# Patient Record
Sex: Male | Born: 1985 | Race: Black or African American | Hispanic: No | Marital: Married
Health system: Midwestern US, Community
[De-identification: ages and names within clinical notes are randomized; demographics above are authoritative.]

## PROBLEM LIST (undated history)

## (undated) DIAGNOSIS — M1990 Unspecified osteoarthritis, unspecified site: Secondary | ICD-10-CM

## (undated) DIAGNOSIS — F32A Depression, unspecified: Secondary | ICD-10-CM

## (undated) DIAGNOSIS — F1910 Other psychoactive substance abuse, uncomplicated: Secondary | ICD-10-CM

## (undated) DIAGNOSIS — F419 Anxiety disorder, unspecified: Secondary | ICD-10-CM

## (undated) DIAGNOSIS — Z9109 Other allergy status, other than to drugs and biological substances: Secondary | ICD-10-CM

## (undated) DIAGNOSIS — K219 Gastro-esophageal reflux disease without esophagitis: Secondary | ICD-10-CM

## (undated) DIAGNOSIS — M25552 Pain in left hip: Secondary | ICD-10-CM

## (undated) HISTORY — DX: Gastro-esophageal reflux disease without esophagitis: K21.9

## (undated) HISTORY — DX: Other allergy status, other than to drugs and biological substances: Z91.09

## (undated) HISTORY — DX: Other psychoactive substance abuse, uncomplicated: F19.10

## (undated) HISTORY — DX: Depression, unspecified: F32.A

## (undated) HISTORY — DX: Unspecified osteoarthritis, unspecified site: M19.90

## (undated) HISTORY — DX: Anxiety disorder, unspecified: F41.9

## (undated) HISTORY — PX: ANTERIOR CRUCIATE LIGAMENT REPAIR: SHX115

---
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
## 2010-02-08 ENCOUNTER — Ambulatory Visit (HOSPITAL_COMMUNITY): Payer: Self-pay | Admitting: Psychology

## 2010-02-15 ENCOUNTER — Ambulatory Visit (HOSPITAL_COMMUNITY): Payer: Self-pay | Admitting: Psychiatry

## 2010-02-22 ENCOUNTER — Ambulatory Visit (HOSPITAL_COMMUNITY)
Admission: RE | Admit: 2010-02-22 | Discharge: 2010-02-22 | Payer: Self-pay | Source: Home / Self Care | Attending: Psychology | Admitting: Psychology

## 2010-03-01 ENCOUNTER — Ambulatory Visit (HOSPITAL_COMMUNITY)
Admission: RE | Admit: 2010-03-01 | Discharge: 2010-03-01 | Payer: Self-pay | Source: Home / Self Care | Attending: Psychology | Admitting: Psychology

## 2010-03-08 ENCOUNTER — Ambulatory Visit (HOSPITAL_COMMUNITY)
Admission: RE | Admit: 2010-03-08 | Discharge: 2010-03-08 | Payer: Self-pay | Source: Home / Self Care | Attending: Psychology | Admitting: Psychology

## 2010-03-15 ENCOUNTER — Ambulatory Visit (HOSPITAL_COMMUNITY)
Admission: RE | Admit: 2010-03-15 | Discharge: 2010-03-15 | Payer: Self-pay | Source: Home / Self Care | Attending: Psychology | Admitting: Psychology

## 2010-03-16 ENCOUNTER — Ambulatory Visit (HOSPITAL_COMMUNITY): Admit: 2010-03-16 | Payer: Self-pay | Admitting: Physician Assistant

## 2010-09-23 ENCOUNTER — Encounter (HOSPITAL_COMMUNITY): Payer: 59 | Admitting: Psychology

## 2010-09-23 DIAGNOSIS — F4322 Adjustment disorder with anxiety: Secondary | ICD-10-CM

## 2010-10-14 ENCOUNTER — Encounter (HOSPITAL_COMMUNITY): Payer: 59 | Admitting: Psychology

## 2010-10-14 DIAGNOSIS — F329 Major depressive disorder, single episode, unspecified: Secondary | ICD-10-CM

## 2010-11-11 ENCOUNTER — Encounter (INDEPENDENT_AMBULATORY_CARE_PROVIDER_SITE_OTHER): Payer: 59 | Admitting: Psychology

## 2010-11-11 DIAGNOSIS — F4320 Adjustment disorder, unspecified: Secondary | ICD-10-CM

## 2010-12-09 ENCOUNTER — Encounter (HOSPITAL_COMMUNITY): Payer: 59 | Admitting: Psychology

## 2011-09-06 ENCOUNTER — Telehealth: Payer: Self-pay | Admitting: Pulmonary Disease

## 2011-09-06 NOTE — Telephone Encounter (Signed)
Per SN---yes we can schedule the next aval for the pt to have a cpx.  thanks

## 2011-09-06 NOTE — Telephone Encounter (Signed)
Called, spoke with pt who states he is in search for a PCP.  His father is a pt of SN's - Bless Bels[facility].  His uncle, Shadd Dunstan, is a pt of SN's as well.  He would like to know if Dr. Kriste Basque would accept him as a patient.  He has not been seen by a PCP recently.  Dr. Kriste Basque, pls advise.  Thank you.

## 2011-09-06 NOTE — Telephone Encounter (Signed)
I spoke with pt and is scheduled to come in 10/17/11 at 11:00 for CPX. Nothing further was needed

## 2011-10-17 ENCOUNTER — Encounter: Payer: 59 | Admitting: Pulmonary Disease

## 2012-01-05 ENCOUNTER — Encounter: Payer: Self-pay | Admitting: *Deleted

## 2012-01-08 ENCOUNTER — Encounter: Payer: Self-pay | Admitting: Pulmonary Disease

## 2012-01-08 ENCOUNTER — Ambulatory Visit (INDEPENDENT_AMBULATORY_CARE_PROVIDER_SITE_OTHER): Payer: BC Managed Care – PPO | Admitting: Pulmonary Disease

## 2012-01-08 ENCOUNTER — Ambulatory Visit (INDEPENDENT_AMBULATORY_CARE_PROVIDER_SITE_OTHER)
Admission: RE | Admit: 2012-01-08 | Discharge: 2012-01-08 | Disposition: A | Discharge status: Home / Self Care | Payer: BC Managed Care – PPO | Source: Ambulatory Visit | Attending: Pulmonary Disease | Admitting: Pulmonary Disease

## 2012-01-08 VITALS — BP 126/82 | HR 51 | Temp 97.8°F | Ht 72.0 in | Wt 260.0 lb

## 2012-01-08 DIAGNOSIS — J351 Hypertrophy of tonsils: Secondary | ICD-10-CM | POA: Insufficient documentation

## 2012-01-08 DIAGNOSIS — Z0000 Encounter for general adult medical examination without abnormal findings: Secondary | ICD-10-CM

## 2012-01-08 DIAGNOSIS — E663 Overweight: Secondary | ICD-10-CM | POA: Insufficient documentation

## 2012-01-08 DIAGNOSIS — Z8379 Family history of other diseases of the digestive system: Secondary | ICD-10-CM | POA: Insufficient documentation

## 2012-01-08 DIAGNOSIS — R0683 Snoring: Secondary | ICD-10-CM | POA: Insufficient documentation

## 2012-01-08 DIAGNOSIS — R0609 Other forms of dyspnea: Secondary | ICD-10-CM

## 2012-01-08 NOTE — Patient Instructions (Addendum)
Joshua Rhodes, it was nice meeting you today...  We did a good baseline screen today w/ EKG, CXR, & you will return one morning soon for the FASTING blood work...    We will contact you w/ the results when avail...  We concentrated on 2 problems today>     1) your tonsillar hypertrophy/ snoring/ r/o obstructive sleep apnea:       We will schedule a Sleep Study at [facility] for further eval of this issue.Marland KitchenMarland Kitchen    2) your +family history of "fatty liver disease":       The liver enzymes on your blood work & the [employer] will tell us what we need to know at this point...  In the meanwhile- both problems can be made better by weight reduction>    So count calories, eat less; exercise, burn more...  Call for any questions...  We will discuss follow up once all this information is gathered.Marland KitchenMarland Kitchen

## 2012-01-11 ENCOUNTER — Ambulatory Visit (HOSPITAL_COMMUNITY)
Admission: RE | Admit: 2012-01-11 | Discharge: 2012-01-11 | Disposition: A | Discharge status: Home / Self Care | Payer: BC Managed Care – PPO | Source: Ambulatory Visit | Attending: Pulmonary Disease | Admitting: Pulmonary Disease

## 2012-01-11 DIAGNOSIS — Z8379 Family history of other diseases of the digestive system: Secondary | ICD-10-CM | POA: Insufficient documentation

## 2012-01-18 ENCOUNTER — Encounter: Payer: Self-pay | Admitting: Pulmonary Disease

## 2012-01-18 NOTE — Progress Notes (Signed)
Subjective:     Patient ID: Joshua Rhodes, male   DOB: 1985-09-19, 26 y.o.   MRN: [id number]  HPI 4 y/o WM, son of Hailey Graham & nephew of Lochlain Bigner, here for an initial visit & CPX>>  ~  January 11, 2012:  Initial OV to establish medical care & CPX;  Good general medical health w/o any complaints or concerns- see prob list below; he already has had the 2013 Flu vaccine...  CXR 11/13 showed normal heart size, clear lungs, WNL.Marland KitchenMarland Kitchen Abd Sonar 11/13 looked good, WNL, no gallstones & liver echotexture appeared normal... EKG 11/13 showed SBrady, rate47, rsr', WNL, NAD... LABS 11/13:  pending         Problem List:    S/P 2 wisdom teeth extracted yrs ago...  ?SNORING? >> he has enlarged tonsils on exam & notes that he rests fair, wakes tired, occas sleep pressure during the day but ESS= only6; we discussed checking a sleep study & he will think about it...   OVERWEIGHT >> He is 260# at 50' tall for an BMI=  ;  He weighed ~225# at age 72 upon graduation from [employer], & his max weight was 315# in 2011;  We reviewed diet, exercise 7 wt reduction strategies...   GERD >> he notes intermittent reflux symptoms and rec to try OTC PPI Rx...  FamHx of NASH >> Father has NASH & Uncle w/ more advanced NASH (NASH score of 8 on Liver bx) and is contemplating bariatric surg...   S/P right ACL surg age 57 >> knee injury w/ ACL tear by his hx, ?if he had surg due to $$ he says...  Hx Headaches when he was younger> none in recent yrs and he feels like he has "grown out of them"   No past medical history on file.   Past Surgical History  Procedure Date  . Anterior cruciate ligament repair     No outpatient encounter prescriptions on file as of 01/08/2012.    No Known Allergies   Current Medications, Allergies, Past Medical History, Past Surgical History, Family History, and Social History were reviewed in [employer] record.   Review of Systems    Constitutional:   Denies F/C/S, anorexia, unexpected weight change. HEENT:  No HA, visual changes, earache, nasal symptoms, sore throat, hoarseness. Resp:  No cough, sputum, hemoptysis; no SOB, tightness, wheezing. Cardio:  No CP, palpit, DOE, orthopnea, edema. GI:  Denies N/V/D/C or blood in stool; no reflux, abd pain, distention, or gas. GU:  No dysuria, freq, urgency, hematuria, or flank pain. MS:  Denies joint pain, swelling, tenderness, or decr ROM; no neck pain, back pain, etc. Neuro:  No tremors, seizures, dizziness, syncope, weakness, numbness, gait abn. Skin:  No suspicious lesions or skin rash. Heme:  No adenopathy, bruising, bleeding. Psyche: Denies confusion, sleep disturbance, hallucinations, anxiety, depression.   Objective:   Physical Exam    Vital Signs:  Reviewed...  General:  WD, WN, 26 y/o WM in NAD; alert & oriented; pleasant & cooperative... HEENT:  [place]/AT; Conjunctiva- pink, Sclera- nonicteric, EOM-wnl, PERRLA, Fundi-benign; EACs-clear, TMs-wnl; NOSE-clear; THROAT-clear & wnl. Neck:  Supple w/ full ROM; no JVD; normal carotid impulses w/o bruits; no thyromegaly or nodules palpated; no lymphadenopathy. Chest:  Clear to P & A; without wheezes, rales, or rhonchi heard. Heart:  Regular Rhythm; norm S1 & S2 without murmurs, rubs, or gallops detected. Abdomen:  Soft & nontender- no guarding or rebound; normal bowel sounds; no organomegaly or masses  palpated. Rectal:  Neg - prostate 2+ & nontender w/o nodules; stool hematest neg. Ext:  Normal ROM; without deformities or arthritic changes; no varicose veins, venous insuffic, or edema;  Pulses intact w/o bruits. Neuro:  CNs II-XII intact; motor testing normal; sensory testing normal; gait normal & balance OK. Derm:  No lesions noted; no rash etc. Lymph:  No cervical, supraclavicular, axillary, or inguinal adenopathy palpated.  RADIOLOGY DATA:  Reviewed in the EPIC EMR & discussed w/ the patient...  LABORATORY DATA:  Reviewed in the EPIC EMR  & discussed w/ the patient...   Assessment:     CPX>>  God general health...  Tonsillar Hypertrophy> R/O OSA>  We discussed the case for Tonsillectomy, ?T&A, ?UPPP for treatment of snoring &/or OSA and suggested a Sleep Study as the first step & he will think about it & let me know;  Alternatively we could refer him to an ENT specialist for their opinion...  OVERWEIGHT>  We discussed diet, exercise, wt reduction strategies...  GERD>  He is rec to take OTC Prilosec 20mg /d...  FamHx NASH>  His grandfather died in his 37s w/ cirrhosis presumably from NASH;  His father & uncle both have it;  Fortunately his sonar was neg- normal liver echotexture;  Labs are pending...   Other medical problems as listed...      Plan:     Patient's Medications   No medications on file

## 2012-01-26 ENCOUNTER — Ambulatory Visit (HOSPITAL_BASED_OUTPATIENT_CLINIC_OR_DEPARTMENT_OTHER): Payer: BC Managed Care – PPO | Attending: Pulmonary Disease | Admitting: Radiology

## 2012-01-26 VITALS — Ht 72.0 in | Wt 260.0 lb

## 2012-01-26 DIAGNOSIS — R0683 Snoring: Secondary | ICD-10-CM

## 2012-01-26 DIAGNOSIS — G4710 Hypersomnia, unspecified: Secondary | ICD-10-CM | POA: Insufficient documentation

## 2012-02-06 DIAGNOSIS — G4710 Hypersomnia, unspecified: Secondary | ICD-10-CM

## 2012-02-06 DIAGNOSIS — G4730 Sleep apnea, unspecified: Secondary | ICD-10-CM

## 2012-02-06 NOTE — Procedures (Cosign Needed)
Joshua Rhodes, Joshua Rhodes NO.:  [id number]  MEDICAL RECORD NO.:  [id number]          PATIENT TYPE:  OUT  LOCATION:  SLEEP CENTER                 FACILITY:  [facility]  PHYSICIAN:  Barbaraann Share, MD,FCCPDATE OF BIRTH:  05/29/1985  DATE OF STUDY:  01/26/2012                           NOCTURNAL POLYSOMNOGRAM  REFERRING PHYSICIAN:  Lonzo Cloud. Kriste Basque, MD  LOCATION:  Sleep Lab.  REFERRING PHYSICIAN:  Lonzo Cloud. Kriste Basque, MD  EPWORTH SLEEPINESS SCORE:  Hypersomnia with sleep apnea.  EPWORTH SLEEPINESS SCORE:  9.  SLEEP ARCHITECTURE:  The patient had total sleep time of 296 minutes with decreased slow wave sleep and only 57 minutes of REM.  Sleep onset latency was prolonged at 59 minutes and REM onset was prolonged at 152 minutes.  Sleep efficiency was poor at 79%.  RESPIRATORY DATA:  The patient was found to have 3 apneas and no obstructive hypopneas, giving him an apnea/hypopnea index of 0.6 events per hour.  The events occur primarily during supine sleep and REM, and there was loud snoring noted throughout.  Even though the patient had decreased slow-wave sleep and REM, he had more than adequate opportunity to exhibit clinically significant obstructive sleep apnea.  OXYGEN DATA:  There was O2 desaturation as low as 92% with his events.  CARDIAC DATA:  No clinically significant arrhythmias were seen.  MOVEMENT/PARASOMNIA:  The patient had no significant leg jerks or other abnormal behaviors noted.  IMPRESSION/RECOMMENDATION:  Small numbers of obstructive events, which do not meet the AHI criteria for the obstructive sleep apnea syndrome. The patient did have loud snoring, and should be encouraged to work aggressively on weight loss.    Barbaraann Share, MD,FCCP Diplomate, American Board of Sleep Medicine   [facility]/MEDQ  D:  02/06/2012 08:48:13  T:  02/06/2012 11:33:50  Job:  [id number]

## 2012-02-13 ENCOUNTER — Telehealth: Payer: Self-pay | Admitting: Pulmonary Disease

## 2012-02-13 NOTE — Telephone Encounter (Signed)
Called and lmomtcb for the pt to discuss his sleep study results.  Per SN  1.  Negative for OSA  Will need to work on weight reduction and stay off of his back while sleeping.

## 2012-02-13 NOTE — Telephone Encounter (Signed)
Pt returned call.  Advised of sleep study results as reported by SN.  Pt verbalized his understanding and denied any questions.  Pt aware he still needs to come for fasting labs and will call to schedule his next physical when due.

## 2012-10-15 ENCOUNTER — Ambulatory Visit (INDEPENDENT_AMBULATORY_CARE_PROVIDER_SITE_OTHER): Payer: PRIVATE HEALTH INSURANCE | Admitting: Psychiatry

## 2012-10-15 DIAGNOSIS — F4323 Adjustment disorder with mixed anxiety and depressed mood: Secondary | ICD-10-CM

## 2012-10-15 DIAGNOSIS — F39 Unspecified mood [affective] disorder: Secondary | ICD-10-CM

## 2012-10-15 DIAGNOSIS — F1910 Other psychoactive substance abuse, uncomplicated: Secondary | ICD-10-CM

## 2012-10-15 DIAGNOSIS — F411 Generalized anxiety disorder: Secondary | ICD-10-CM

## 2012-10-16 ENCOUNTER — Encounter (HOSPITAL_COMMUNITY): Payer: Self-pay | Admitting: Psychiatry

## 2012-10-16 NOTE — Progress Notes (Signed)
Patient ID: [name], male   DOB: December 09, 1985, 27 y.o.   MRN: [id number] Presenting Problem Chief Complaint: prescription pill abuse, anxiety, depression  What are the main stressors in your life right now, how long? Work stress, school (Surveyor, minerals in Patent attorney), relationship stress, lives with younger brother who does not help with household responsibilities  Previous mental health services Have you ever been treated for a mental health problem, when, where, by whom? Yes. Was last seen by Clerance Lav and Dr. Lolly Mustache 3 years ago    Are you currently seeing a therapist or counselor, counselor's name? No   Have you ever had a mental health hospitalization, how many times, length of stay? No   Have you ever been treated with medication, name, reason, response? No   Have you ever had suicidal thoughts or attempted suicide, when, how? No   Risk factors for Suicide Demographic factors:  Male and Caucasian Current mental status: none Loss factors: none Historical factors: none Risk Reduction factors: Living with another person, especially a relative Clinical factors:  Depression and anxiety Cognitive features that contribute to risk: none  SUICIDE RISK:  Minimal: No identifiable suicidal ideation.  Patients presenting with no risk factors but with morbid ruminations; may be classified as minimal risk based on the severity of the depressive symptoms  Medical history Medical treatment and/or problems, explain: Yes. Surgery for torn acl last year     Current medications: history and current prescription med abuse Prescribed by: n/a  Social/family history Have you been married, how many times?  no   Who lives in your current household? Lives with younger brother  Military history: No   Religious/spiritual involvement:  What religion/faith base are you? deferred  Family of origin (childhood history)  Where were you born? [facility] Where did you grow up?  [facility]  Describe the atmosphere of the household where you grew up: emotionally disengaged from mother and father Do you have siblings, step/half siblings, list names, relation, sex, age? Yes. Two younger brothers  Are your parents separated/divorced, when and why? Parents divorced  Are your parents alive? Yes   Social supports (personal and professional): lives with younger brother, but does not have a close relationship. 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  Education How many grades have you completed? some college Did you have any problems in school, what type? No  Medications prescribed for these problems? No   Employment (financial issues) Works as Materials engineer with [employer]  Legal history none  Trauma/Abuse history: Have you ever been exposed to any form of abuse, what type? No   Have you ever been exposed to something traumatic, describe? No   Substance use   Do you use Alcohol? Yes Type, frequency? Primarily weekend use. Range from 3-9 beers depending on Friday and Saturday night mixed with prescription pills  How old were you went you first tasted alcohol? 16  When was your last drink, type, how much? Saturday night had nine beers, felt severely hung over on Sunday morning.  Have you ever used illicit drugs or taken more than prescribed, type, frequency, date of last usage? Yes. Pt. Has stopped using prescription pills when he was seeing Clerance Lav 3 years ago, but has resumed prescription pill abuse.  Pt. Reports that he uses marijuana almost daily to help with sleep and uses vicodin, percocet, oxycontin, valium, klonopin, and roxys daily and  as often as he can afford to manage anxiety.  Mental Status: General Appearance Luretha Murphy:  Casual Eye Contact:  Good Motor Behavior:  Normal Speech:  Normal Level of Consciousness:  Alert Mood:   Dysphoric Affect:  Constricted Anxiety Level: moderate Thought Process:  Coherent Thought Content:  WNL Perception:  Normal Judgment:  Good Insight:  Present Cognition:  wnl  Diagnosis AXIS I Anxiety Disorder NOS, Mood Disorder NOS and Substance Abuse  AXIS II No diagnosis  AXIS III No past medical history on file.  AXIS IV occupational problems and other psychosocial or environmental problems  AXIS V 51-60 moderate symptoms   Plan: Pt. Was last seen by Dr. Lolly Mustache and Clerance Lav 3 years ago. Pt. Has resumed abuse of prescription pills to manage anxiety (vicodin, percocet, oxycontin, valium, klonopin, and roxys) and smokes marijuana to help with sleep. Pt. Drinks 3-9 beers Friday-Saturday and feels that his drinking is out of control. Pt. Feels that drug use has affected work performance with excessive absences and loss of work Associate Professor. Pt. 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 In his spare time patient enjoys umpiring baseball, but has not been able to participate due to ACL surgery earlier this year. Pt. Lives with younger brother and unhappy with brother's lack of responsibility with household chores. Pt. Feels stress from on and off again relationship with girlfriend who pressures him for long-term commitment and disapproves of drug use. Pt. Reports disturbed sleep, poor eating habits due to regular work related travel, and difficulty with exercise due to injury. Made referral to psychiatrist. Pt. To return in 1-2 weeks for continued assessment.  _________________________________________       Jonna Clark, Ph.D., LPC, NCC

## 2012-10-30 ENCOUNTER — Ambulatory Visit (INDEPENDENT_AMBULATORY_CARE_PROVIDER_SITE_OTHER): Payer: PRIVATE HEALTH INSURANCE | Admitting: Psychiatry

## 2012-10-30 ENCOUNTER — Encounter (HOSPITAL_COMMUNITY): Payer: Self-pay | Admitting: Psychiatry

## 2012-10-30 DIAGNOSIS — F329 Major depressive disorder, single episode, unspecified: Secondary | ICD-10-CM

## 2012-10-30 DIAGNOSIS — F1910 Other psychoactive substance abuse, uncomplicated: Secondary | ICD-10-CM | POA: Insufficient documentation

## 2012-10-30 NOTE — Progress Notes (Signed)
   THERAPIST PROGRESS NOTE  Session Time: 10:00-10:50  Participation Level: Active  Behavioral Response: CasualAlertDysphoric  Type of Therapy: Individual Therapy  Treatment Goals addressed: emotion regulation, substance dependence  Interventions: CBT  Summary: Joshua Rhodes is a 27 y.o. male who presents with depression, substance and dependence.   Suicidal/Homicidal: Nowithout intent/plan  Therapist Response: Pt. Reports that he has abused xanax and alcohol since our last session. Reports that despite exhausting sick days due to acl injury, pt. Continues to call in sick as recently as for two days after labor day because he could not bring himself to go to work. Pt. Reports that his work is anxiety provoking due to time pressure, need for accuracy, and working with his father and uncle. Pt. Has emotionally disengaged relationships with his mother and father who are divorced and limited social support from family and friends. Pt. 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 Pt. Feels that this change would be more consistent with his strengths, but is resistant to making the change out of fear of disappointing his father. Recommended that Pt. consider inpatient or outpatient substance rehabilitation. Pt. Has never done substance rehabilitation. Pt is scheduled to see Dr. Lolly Mustache on 12/01/12.  Plan: Return again in 2 weeks.  Diagnosis: Axis I: Depressive Disorder NOS and Substance Abuse    Axis II: No diagnosis    Wynonia Musty 10/30/2012

## 2012-11-14 ENCOUNTER — Ambulatory Visit (INDEPENDENT_AMBULATORY_CARE_PROVIDER_SITE_OTHER): Payer: PRIVATE HEALTH INSURANCE | Admitting: Psychiatry

## 2012-11-14 ENCOUNTER — Encounter (HOSPITAL_COMMUNITY): Payer: Self-pay | Admitting: Psychiatry

## 2012-11-14 ENCOUNTER — Encounter (HOSPITAL_COMMUNITY): Payer: Self-pay

## 2012-11-14 DIAGNOSIS — F1920 Other psychoactive substance dependence, uncomplicated: Secondary | ICD-10-CM | POA: Insufficient documentation

## 2012-11-14 DIAGNOSIS — F329 Major depressive disorder, single episode, unspecified: Secondary | ICD-10-CM

## 2012-11-14 NOTE — Progress Notes (Signed)
Patient ID: Joshua Rhodes, male   DOB: March 26, 1985, 27 y.o.   MRN: [id number]  THERAPIST PROGRESS NOTE  Session Time: 10:00-10:50   Participation Level: Active   Behavioral Response: CasualAlertDysphoric   Type of Therapy: Individual Therapy   Treatment Goals addressed: emotion regulation, substance dependence   Interventions: CBT   Summary: Joshua Rhodes is a 27 y.o. male who presents with depression, substance dependence.   Suicidal/Homicidal: Nowithout intent/plan   Therapist Response: Pt. Reports that he continues drug seeing, daily xanax (morning, afternoon, evening, and 2-3 at night to sleep), weekend binge drinking alcohol, and marijuana use. Pt. 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 Is not sure how much he spends a month. Pt. Describes self as addictive personality. Despite discontentment at work and prior decision to pursue physical education degree and teacher certification, Pt. Has decided that he needs to commit to current job in order to "be responsible and take advantage of what has been given to me." Current employment continues to be anxiety provoking. Pt. Has been confronted by his co-worker uncle and department head about excessive absences. I brought in Ann to discuss dangers of current use, and risk of seizure if use is discontinued abruptly without medical intervention. Ann also discussed CDIOP program. Pt. Has been rescheduled with Dr. Lolly Mustache for October 23.  Plan: Return again in 2 weeks.   Diagnosis: Axis I: Depressive Disorder NOS and Substance Dependence  Axis II: No diagnosis   Wynonia Musty  11/14/2012

## 2012-11-28 ENCOUNTER — Encounter (HOSPITAL_COMMUNITY): Payer: Self-pay | Admitting: Psychiatry

## 2012-11-28 ENCOUNTER — Encounter (HOSPITAL_COMMUNITY): Payer: Self-pay

## 2012-11-28 ENCOUNTER — Ambulatory Visit (INDEPENDENT_AMBULATORY_CARE_PROVIDER_SITE_OTHER): Payer: PRIVATE HEALTH INSURANCE | Admitting: Psychiatry

## 2012-11-28 DIAGNOSIS — F1920 Other psychoactive substance dependence, uncomplicated: Secondary | ICD-10-CM

## 2012-11-28 DIAGNOSIS — F329 Major depressive disorder, single episode, unspecified: Secondary | ICD-10-CM

## 2012-11-28 NOTE — Progress Notes (Signed)
Patient ID: Joshua Rhodes, male   DOB: 11-Jun-1985, 27 y.o.   MRN: [id number]  Session Time: 10:00-10:50   Participation Level: Active   Behavioral Response: CasualAlertDysphoric   Type of Therapy: Individual Therapy   Treatment Goals addressed: emotion regulation, substance dependence   Interventions: CBT   Summary: [name] is a 27 y.o. male who presents with depression, substance dependence.   Suicidal/Homicidal: Nowithout intent/plan   Therapist Response: Pt. Reports that he stopped using xanax and cannabis in preparation for drug testing at work. Session focused on history and patterns of drinking; Pt. Reports that he has never been able to drink in moderation with one drink turning into a binge. Despite Pt. Self-description as an "addictive personality" demonstrates denial about the drug use because he has been a functional addict who has maintained current job and does well in college courses.  Pt. Has numerous strengths and resources that have kept him from hitting bottom in hhis addiction. Pt. Acknowledges that he has lost relationships, jobs and is beginning to suffer consequences at work because of his anxiety, depression and history of medicating with alcohol, cannabis, and xanax. Developed treatment plan. Pt. scheduled with Dr. Lolly Mustache for October 23.   Plan: Return again in 2 weeks.   Diagnosis: Axis I: Depressive Disorder NOS and Substance Dependence   Axis II: No diagnosis  Wynonia Musty  11/28/2012

## 2012-12-03 ENCOUNTER — Ambulatory Visit (HOSPITAL_COMMUNITY): Payer: Self-pay | Admitting: Physician Assistant

## 2012-12-10 ENCOUNTER — Ambulatory Visit (HOSPITAL_COMMUNITY): Payer: Self-pay | Admitting: Physician Assistant

## 2012-12-12 ENCOUNTER — Encounter (INDEPENDENT_AMBULATORY_CARE_PROVIDER_SITE_OTHER): Payer: Self-pay

## 2012-12-12 ENCOUNTER — Encounter (HOSPITAL_COMMUNITY): Payer: Self-pay | Admitting: Psychiatry

## 2012-12-12 ENCOUNTER — Ambulatory Visit (INDEPENDENT_AMBULATORY_CARE_PROVIDER_SITE_OTHER): Payer: PRIVATE HEALTH INSURANCE | Admitting: Psychiatry

## 2012-12-12 VITALS — BP 126/91 | HR 58 | Ht 72.0 in | Wt 266.8 lb

## 2012-12-12 DIAGNOSIS — F1994 Other psychoactive substance use, unspecified with psychoactive substance-induced mood disorder: Secondary | ICD-10-CM

## 2012-12-12 DIAGNOSIS — F1920 Other psychoactive substance dependence, uncomplicated: Secondary | ICD-10-CM

## 2012-12-12 DIAGNOSIS — F319 Bipolar disorder, unspecified: Secondary | ICD-10-CM

## 2012-12-12 MED ORDER — QUETIAPINE FUMARATE 100 MG PO TABS: ORAL_TABLET | ORAL | Status: DC

## 2012-12-12 MED ORDER — LAMOTRIGINE 25 MG PO TABS: ORAL_TABLET | ORAL | Status: DC
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
## 2012-12-19 ENCOUNTER — Ambulatory Visit (HOSPITAL_COMMUNITY): Payer: Self-pay | Admitting: Psychiatry

## 2013-01-01 ENCOUNTER — Ambulatory Visit (HOSPITAL_COMMUNITY): Payer: Self-pay | Admitting: Psychiatry

## 2013-01-02 ENCOUNTER — Ambulatory Visit (HOSPITAL_COMMUNITY): Payer: PRIVATE HEALTH INSURANCE | Admitting: Psychiatry

## 2013-01-03 ENCOUNTER — Encounter (HOSPITAL_COMMUNITY): Payer: Self-pay

## 2013-01-13 ENCOUNTER — Other Ambulatory Visit (HOSPITAL_COMMUNITY): Payer: Self-pay | Admitting: *Deleted

## 2013-01-13 DIAGNOSIS — F319 Bipolar disorder, unspecified: Secondary | ICD-10-CM

## 2013-01-13 MED ORDER — LAMOTRIGINE 25 MG PO TABS: 50.0000 mg | ORAL_TABLET | Freq: Every day | ORAL | Status: DC

## 2013-01-21 ENCOUNTER — Ambulatory Visit (INDEPENDENT_AMBULATORY_CARE_PROVIDER_SITE_OTHER): Payer: PRIVATE HEALTH INSURANCE | Admitting: Psychiatry

## 2013-01-21 ENCOUNTER — Encounter (HOSPITAL_COMMUNITY): Payer: Self-pay | Admitting: Psychiatry

## 2013-01-21 ENCOUNTER — Ambulatory Visit (HOSPITAL_COMMUNITY): Payer: Self-pay | Admitting: Psychiatry

## 2013-01-21 VITALS — BP 120/64 | HR 56 | Ht 72.0 in | Wt 272.6 lb

## 2013-01-21 DIAGNOSIS — F1920 Other psychoactive substance dependence, uncomplicated: Secondary | ICD-10-CM

## 2013-01-21 DIAGNOSIS — F1994 Other psychoactive substance use, unspecified with psychoactive substance-induced mood disorder: Secondary | ICD-10-CM

## 2013-01-21 DIAGNOSIS — F319 Bipolar disorder, unspecified: Secondary | ICD-10-CM

## 2013-01-21 MED ORDER — LAMOTRIGINE 100 MG PO TABS: 100.0000 mg | ORAL_TABLET | Freq: Every day | ORAL | Status: DC
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
## 2013-02-10 ENCOUNTER — Ambulatory Visit (HOSPITAL_COMMUNITY): Payer: Self-pay | Admitting: Psychiatry

## 2013-03-03 ENCOUNTER — Telehealth (HOSPITAL_COMMUNITY): Payer: Self-pay | Admitting: *Deleted

## 2013-03-03 NOTE — Telephone Encounter (Signed)
See phone note

## 2013-03-17 ENCOUNTER — Other Ambulatory Visit (HOSPITAL_COMMUNITY): Payer: Self-pay | Admitting: *Deleted

## 2013-03-17 DIAGNOSIS — F319 Bipolar disorder, unspecified: Secondary | ICD-10-CM

## 2013-03-17 MED ORDER — QUETIAPINE FUMARATE 100 MG PO TABS: ORAL_TABLET | ORAL | Status: DC

## 2013-03-26 ENCOUNTER — Ambulatory Visit (HOSPITAL_COMMUNITY): Payer: Self-pay | Admitting: Psychiatry

## 2013-04-01 ENCOUNTER — Ambulatory Visit (INDEPENDENT_AMBULATORY_CARE_PROVIDER_SITE_OTHER): Payer: PRIVATE HEALTH INSURANCE | Admitting: Psychiatry

## 2013-04-01 ENCOUNTER — Encounter (HOSPITAL_COMMUNITY): Payer: Self-pay | Admitting: Psychiatry

## 2013-04-01 VITALS — BP 132/66 | HR 64 | Ht 72.0 in | Wt 258.4 lb

## 2013-04-01 DIAGNOSIS — F319 Bipolar disorder, unspecified: Secondary | ICD-10-CM

## 2013-04-01 DIAGNOSIS — F1920 Other psychoactive substance dependence, uncomplicated: Secondary | ICD-10-CM

## 2013-04-01 DIAGNOSIS — F3130 Bipolar disorder, current episode depressed, mild or moderate severity, unspecified: Secondary | ICD-10-CM

## 2013-04-01 DIAGNOSIS — F1994 Other psychoactive substance use, unspecified with psychoactive substance-induced mood disorder: Secondary | ICD-10-CM

## 2013-04-01 MED ORDER — LAMOTRIGINE 150 MG PO TABS: 150.0000 mg | ORAL_TABLET | Freq: Every day | ORAL | Status: DC

## 2013-04-01 MED ORDER — QUETIAPINE FUMARATE 100 MG PO TABS: 100.0000 mg | ORAL_TABLET | Freq: Every day | ORAL | Status: DC

## 2013-04-01 NOTE — Progress Notes (Signed)
[facility] [id number] Progress Note   Joshua CurryJustin A Rhodes [id number] 28 y.o.  04/01/2013 4:32 PM  Chief Complaint:  I stopped drinking alcohol and smoking marijuana.  I feel very anxious.  I'm taking Seroquel 100 mg at bedtime for sleep.      History of Present Illness:  [name] came for his followup appointment.  He stopped using marijuana and stop drinking alcohol.  He reported 3 weeks ago he decided to stop alcohol and marijuana.  He admitted missing more days because of hung over .  He noticed more anxious, irritability and past one week.  He feels nervous around people but denies any suicidal thoughts homicidal thoughts or any agitation.  He is taking Seroquel 100 mg at bedtime it is helping his sleep.  He denies any rash or itching.  He was to continue his current psychotropic medication.  He continues to have social isolation and sometimes withdrawn .  He feels sometimes discouragement in his life but he is hoping after stopping the substances he can do better in his life.  He is not using Xanax or any street medication from the outside.  He likes to Lamictal.  He has no rash or itching.  He is not interested in counseling.  He admitted to stress at work but he wants to continue his current job.  He has a hallucination, paranoia or any suicidal thoughts.  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  Suicidal Ideation: No Plan Formed: No Patient has means to carry out plan: No  Homicidal Ideation: No Plan Formed: No Patient has means to carry out plan: No  Past Psychiatric History/Hospitalization(s) Patient denies any history of suicidal attempt or any inpatient psychiatric treatment.  He admitted history of abusing drugs and alcohol at early age.  He endorses history of mania, elevated mood, impulsive behavior with excessive shopping and getting speeding tickets.  When he is very manic he loses his attention concentration.  He also endorsed highs and lows his mood.  He  gets very depressed but he denies ever having suicidal thoughts.  He has seen therapist in 2011 when he broke up with his girlfriend.  Patient denies any psychosis, paranoia, hallucination, suicidal thoughts or homicidal thoughts.  He denies any history of PTSD or OCD symptoms. Anxiety: Yes Bipolar Disorder: Yes Depression: Yes Mania: Yes Psychosis: No Schizophrenia: No Personality Disorder: No Hospitalization for psychiatric illness: No History of Electroconvulsive Shock Therapy: No Prior Suicide Attempts: No  Medical History; History of knee injury.  His primary care physician is Dr. Alroy DustScott Nadel.  He has not seen him in one year.  He has no blood work.  Patient denies any history of seizures, head injury or any loss of consciousness.  Patient has sleep study done which shows mild sleep apnea.  Psychosocial History; Patient lives with his younger brother.  His parents are separated when he was 89 years old.  Patient has 2 other brother.  Patient is currently not in a serious relationship.  Substance Abuse History; Patient stopped drinking alcohol and smoking marijuana .  He has extensive history of drug use and alcohol.  He admitted binge drinking mostly on the weekends.  He has used pain medication, benzodiazepine, marijuana on a regular basis.  He also tried cocaine a few times in his life.  Patient has never treated for his addiction.  Patient denies any tremors, shakes or any withdrawal symptoms.  He denies any intravenous drug use.   Review  of Systems: Psychiatric: Agitation: No Hallucination: No Depressed Mood: Yes Insomnia: Yes Hypersomnia: No Altered Concentration: No Feels Worthless: No Grandiose Ideas: No Belief In Special Powers: No New/Increased Substance Abuse: No Compulsions: No  Neurologic: Headache: No Seizure: No Paresthesias: No    Outpatient Encounter Prescriptions as of 04/01/2013  Medication Sig  . lamoTRIgine (LAMICTAL) 150 MG tablet Take 1 tablet  (150 mg total) by mouth daily.  . QUEtiapine (SEROQUEL) 100 MG tablet Take 1 tablet (100 mg total) by mouth at bedtime.  . [DISCONTINUED] lamoTRIgine (LAMICTAL) 100 MG tablet Take 1 tablet (100 mg total) by mouth daily.  . [DISCONTINUED] QUEtiapine (SEROQUEL) 100 MG tablet Take 1/2 to 1 at bed time    No results found for this or any previous visit (from the past 2160 hour(s)).    Physical Exam: Constitutional:  BP 132/66  Pulse 64  Ht 6' (1.829 m)  Wt 258 lb 6.4 oz (117.209 kg)  BMI 35.04 kg/m2  Musculoskeletal: Strength & Muscle Tone: within normal limits Gait & Station: normal Patient leans: N/A  Mental Status Examination;  Patient is overweight man who is well groomed and well dressed.  He is anxious but cooperative.  He maintained good eye contact.  His speech is clear, fluent and coherent.  His thought processes logical and goal-directed.  He described his mood as sad depressed and his affect is constricted.  His fund of knowledge is adequate.  He denies any paranoia, delusional or any of the session.  He denies any active or passive suicidal thoughts or homicidal thoughts.  He denies any auditory or visual hallucination.  There are no flight of ideas or any loose association present at this time.  His attention and concentration is fair.  Psychomotor activity is normal.  There were no tremors or shakes.  His insight judgment and impulse control is okay.   Medical Decision Making (Choose Three): Established Problem, Stable/Improving (1), Review of Psycho-Social Stressors (1), Review of Last Therapy Session (1), Review of Medication Regimen & Side Effects (2) and Review of New Medication or Change in Dosage (2)  Assessment: Axis I: Bipolar disorder depressed type, substance nduced mood disorder, polysubstance dependence  Axis II: Deferred  Axis III:  History reviewed. No pertinent past medical history.  Axis IV: Moderate   Plan:  I will increase his Lamictal 150 mg  daily.  Continue Seroquel 100 mg at bedtime.  Patient does not have any side effects.  He is able to loose some weight from the past.  He is more active and in watching his calorie intake.  Discussed coping skills.  Reassurance given about stopping his substance use.  Discussed in detail about his craving however patient is confident that he can stop substance permanently.  I recommend to call us back if he has any question or any concern.  Patient has not seen his primary care physician.  Reinforce blood work.  Followup in 2 months .  Time spent 25 minutes.  More than 50% of the time spent in psychoeducation, counseling and coordination of care.  Discuss safety plan that anytime having active suicidal thoughts or homicidal thoughts then patient need to call 911 or go to the local emergency room.   Elouise Divelbiss T., MD 04/01/2013

## 2013-04-30 ENCOUNTER — Telehealth (HOSPITAL_COMMUNITY): Payer: Self-pay | Admitting: *Deleted

## 2013-04-30 NOTE — Telephone Encounter (Signed)
Patient left NW:[id number]:Wants to ask Dr. Lolly MustacheArfeen to increase Seroquel dose.Currently on 100 mg.Helped to sleep a lot in the beginning.Not working as well now.Wonders if he built up a tolerance to oit?Tried taking 1.5 tablets and it helped him sleep.

## 2013-05-01 ENCOUNTER — Telehealth (HOSPITAL_COMMUNITY): Payer: Self-pay | Admitting: Psychiatry

## 2013-05-01 DIAGNOSIS — F319 Bipolar disorder, unspecified: Secondary | ICD-10-CM

## 2013-05-01 MED ORDER — QUETIAPINE FUMARATE 100 MG PO TABS: ORAL_TABLET | ORAL | Status: DC

## 2013-05-01 NOTE — Telephone Encounter (Signed)
I returned patient's phone call.  He is complaining of poor sleep and does not feels are all 100 mg is working very well.  He tried one 50 mg it seems to be working good.  I recommended to try Seroquel 150 mg at bedtime.  I will call in new prescription to his pharmacy.

## 2013-05-13 ENCOUNTER — Ambulatory Visit (HOSPITAL_COMMUNITY): Payer: Self-pay | Admitting: Psychiatry

## 2013-06-04 ENCOUNTER — Ambulatory Visit (HOSPITAL_COMMUNITY): Payer: Self-pay | Admitting: Psychiatry

## 2013-06-11 ENCOUNTER — Telehealth (HOSPITAL_COMMUNITY): Payer: Self-pay | Admitting: *Deleted

## 2013-06-11 NOTE — Telephone Encounter (Signed)
Patient left ZO:XWRUEVM:Needs refill of Lamictal.Changing jobs, no insurance.How does this work? Can refill be called to pharmacy and he just pay for it?  Left message for pt: Advised that MD could send refill of Lamictal to any pharmacy he chooses.He can discuss with pharmacist to find out cost. Asked him to call office with pharmacy information if this is what he wants to do.

## 2013-06-13 ENCOUNTER — Other Ambulatory Visit (HOSPITAL_COMMUNITY): Payer: Self-pay | Admitting: Psychiatry

## 2013-06-13 DIAGNOSIS — F319 Bipolar disorder, unspecified: Secondary | ICD-10-CM

## 2013-07-09 ENCOUNTER — Telehealth (HOSPITAL_COMMUNITY): Payer: Self-pay | Admitting: *Deleted

## 2013-07-09 ENCOUNTER — Other Ambulatory Visit (HOSPITAL_COMMUNITY): Payer: Self-pay | Admitting: Psychiatry

## 2013-07-09 NOTE — Telephone Encounter (Signed)
Patient left ZO:XWRUEVM:Wants refill of Lamictal.Last appt 04/01/13.Last refill 4/27 with note "Needs to make appt for follow up to receive ANY future refills".States can't afford tocome to see MD at this time.Won't have insurance for 60 days.No insurance-between jobs.Wants to know how he can get refill? Note: Last appt 04/01/13.No Show 06/04/13. No appt scheduled - see note regarding lack of insurance

## 2013-07-15 ENCOUNTER — Other Ambulatory Visit (HOSPITAL_COMMUNITY): Payer: Self-pay | Admitting: Psychiatry

## 2013-07-15 NOTE — Telephone Encounter (Signed)
Patient must see provider for future refills.

## 2013-07-17 ENCOUNTER — Telehealth (HOSPITAL_COMMUNITY): Payer: Self-pay | Admitting: *Deleted

## 2013-07-17 NOTE — Telephone Encounter (Signed)
Patient left KB:TCYEL medicine refilled.His request was refused, it might be because he does not have an appt. No appt at this time.No insurance.Will be getting Insurance soon.  Contacted patient:Advised patient that last appt in February and no appt scheduled.He states he will have insurance soon.Advised pt to make appt  Patient has appt scheduled 09/16/13

## 2013-07-21 ENCOUNTER — Telehealth (HOSPITAL_COMMUNITY): Payer: Self-pay | Admitting: *Deleted

## 2013-07-21 DIAGNOSIS — F319 Bipolar disorder, unspecified: Secondary | ICD-10-CM

## 2013-07-21 NOTE — Telephone Encounter (Signed)
Patient left VM received at 855 on 6/1:Needs refill of medications.Made appt as requested--on 7/28

## 2013-07-22 MED ORDER — LAMOTRIGINE 150 MG PO TABS: ORAL_TABLET | ORAL | Status: DC

## 2013-07-22 MED ORDER — QUETIAPINE FUMARATE 100 MG PO TABS
ORAL_TABLET | ORAL | Status: DC
Start: 2013-07-22 — End: 2013-09-10

## 2013-07-22 NOTE — Addendum Note (Signed)
Addended by: Tonny Bollman on: 07/22/2013 12:08 PM   Modules accepted: Orders

## 2013-07-22 NOTE — Telephone Encounter (Addendum)
Per MD: Contact patient-Can only give refill for 30 days. Needs earlier appt 30 day refills sent to pharmacy of record. Phoned patient - mail box not set up. Will try again

## 2013-09-01 ENCOUNTER — Other Ambulatory Visit (HOSPITAL_COMMUNITY): Payer: Self-pay | Admitting: *Deleted

## 2013-09-09 ENCOUNTER — Other Ambulatory Visit (HOSPITAL_COMMUNITY): Payer: Self-pay | Admitting: *Deleted

## 2013-09-09 DIAGNOSIS — F319 Bipolar disorder, unspecified: Secondary | ICD-10-CM

## 2013-09-10 MED ORDER — QUETIAPINE FUMARATE 100 MG PO TABS: ORAL_TABLET | ORAL | Status: DC

## 2013-09-10 MED ORDER — LAMOTRIGINE 150 MG PO TABS: ORAL_TABLET | ORAL | Status: DC

## 2013-09-16 ENCOUNTER — Ambulatory Visit (HOSPITAL_COMMUNITY): Payer: Self-pay | Admitting: Psychiatry

## 2013-09-22 ENCOUNTER — Encounter (HOSPITAL_COMMUNITY): Payer: Self-pay | Admitting: Psychiatry

## 2013-09-22 ENCOUNTER — Ambulatory Visit (INDEPENDENT_AMBULATORY_CARE_PROVIDER_SITE_OTHER): Payer: PRIVATE HEALTH INSURANCE | Admitting: Psychiatry

## 2013-09-22 VITALS — BP 118/50 | HR 52 | Ht 72.0 in | Wt 234.8 lb

## 2013-09-22 DIAGNOSIS — F319 Bipolar disorder, unspecified: Secondary | ICD-10-CM

## 2013-09-22 MED ORDER — LAMOTRIGINE 25 MG PO TABS: ORAL_TABLET | ORAL | Status: AC

## 2013-09-22 MED ORDER — QUETIAPINE FUMARATE 100 MG PO TABS: 100.0000 mg | ORAL_TABLET | Freq: Every day | ORAL | Status: AC

## 2013-09-22 NOTE — Progress Notes (Signed)
[facility] [id number] Progress Note   Donnalee CurryJustin A Ruest [id number] 28 y.o.  09/22/2013 4:49 PM  Chief Complaint:  I am not taking my medication.  I was without insurance.  I need to stop the medicine.  I started again drinking alcohol.        History of Present Illness:  [name] came for his followup appointment.  He was last seen in February .  He admitted noncompliant with medication because he has no insurance .  He's been experiencing increased irritability, anger, mood swing and agitation.  He is sleeping only 2-3 hours.  He lapsed into drinking .  He is drinking vodka mostly on the weekends .  He started a new job and he feels a new job is working very well.  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  Patient was taking Lamictal and Seroquel .  He has noticed irritability and anger.  He is not using Xanax or any other street medication other than marijuana and alcohol.  He denies any hallucination, paranoia, suicidal thoughts or homicidal thoughts.  He lives with his brother.  He recalls taking Lamictal and Seroquel which helps his illness.  He did not have any rash or itching.  The patient has lost weight from the past.  He reported that he is working out.  His vitals are stable.   Suicidal Ideation: No Plan Formed: No Patient has means to carry out plan: No  Homicidal Ideation: No Plan Formed: No Patient has means to carry out plan: No  Past Psychiatric History/Hospitalization(s) Patient denies any history of suicidal attempt or any inpatient psychiatric treatment.  He admitted history of abusing drugs and alcohol at early age.  He endorses history of mania, elevated mood, impulsive behavior with excessive shopping and getting speeding tickets.  When he is very manic he loses his attention concentration.  He also endorsed highs and lows his mood.  He gets very depressed but he denies ever having suicidal thoughts.  He has seen therapist in 2011 when he broke  up with his girlfriend.  Patient denies any psychosis, paranoia, hallucination, suicidal thoughts or homicidal thoughts.  He denies any history of PTSD or OCD symptoms. Anxiety: Yes Bipolar Disorder: Yes Depression: Yes Mania: Yes Psychosis: No Schizophrenia: No Personality Disorder: No Hospitalization for psychiatric illness: No History of Electroconvulsive Shock Therapy: No Prior Suicide Attempts: No  Medical History; Patient has history of knee injury.  His primary care physician is Dr. Alroy DustScott Nadel.  He has not seen him in a while. Patient has sleep study done which shows mild sleep apnea.  Review of Systems: Psychiatric: Agitation: No Hallucination: No Depressed Mood: Yes Insomnia: Yes Hypersomnia: No Altered Concentration: No Feels Worthless: No Grandiose Ideas: No Belief In Special Powers: No New/Increased Substance Abuse: Yes Compulsions: No  Neurologic: Headache: No Seizure: No Paresthesias: No    Outpatient Encounter Prescriptions as of 09/22/2013  Medication Sig  . lamoTRIgine (LAMICTAL) 25 MG tablet TAKE 1 tab daily for 1 week and than 2 tab daily  . QUEtiapine (SEROQUEL) 100 MG tablet Take 1 tablet (100 mg total) by mouth at bedtime.  . [DISCONTINUED] lamoTRIgine (LAMICTAL) 150 MG tablet TAKE 1 TABLET BY MOUTH DAILY  . [DISCONTINUED] QUEtiapine (SEROQUEL) 100 MG tablet Take 1 and 1/2 tab at bed time.    No results found for this or any previous visit (from the past 2160 hour(s)).    Physical Exam: Constitutional:  BP 118/50  Pulse  52  Ht 6' (1.829 m)  Wt 234 lb 12.8 oz (106.505 kg)  BMI 31.84 kg/m2  Musculoskeletal: Strength & Muscle Tone: within normal limits Gait & Station: normal Patient leans: N/A  Mental Status Examination;  Patient is overweight man who is  casually dressed .  He described his mood is irritable and anxious.  His affect is constricted.  He maintained fair eye contact.  His speech is clear, fluent and coherent.  His thought  processes logical and goal-directed.  His fund of knowledge is adequate.  He denies any paranoia, delusional or any of the session.  He denies any active or passive suicidal thoughts or homicidal thoughts.  He denies any auditory or visual hallucination.  There are no flight of ideas or any loose association present at this time.  His attention and concentration is fair.  Psychomotor activity isincreased.  There were no tremors or shakes.  His insight judgment and impulse control is okay.  Established Problem, Stable/Improving (1), Review of Psycho-Social Stressors (1), Review and summation of old records (2), Established Problem, Worsening (2), Review of Last Therapy Session (1), Review of Medication Regimen & Side Effects (2) and Review of New Medication or Change in Dosage (2)  Assessment: Axis I: Bipolar disorder depressed type, substance nduced mood disorder, polysubstance dependence  Axis II: Deferred  Axis III:  History reviewed. No pertinent past medical history.  Axis IV: Moderate   Plan:   I reviewed the records and psychosocial stressors.  Discussed to stop drinking and smoking marijuana.  Patient had a good response with Lamictal and Seroquel.  I will start Seroquel 100 mg at bedtime and Lamictal 25 mg daily for one week and then 50 mg daily.  Reinforced medication compliance and followups.  I would recommend to see counselor for coping and social skills and recommended to start AA meeting .  Discuss alcohol abuse , interaction with psychotropic medication and potential consequences. Reinforce blood work.  Followup in  to 4 weeks. Time spent 25 minutes.  More than 50% of the time spent in psychoeducation, counseling and coordination of care.  Discuss safety plan that anytime having active suicidal thoughts or homicidal thoughts then patient need to call 911 or go to the local emergency room.   Adithya Difrancesco T., MD 09/22/2013

## 2013-09-26 ENCOUNTER — Ambulatory Visit (INDEPENDENT_AMBULATORY_CARE_PROVIDER_SITE_OTHER): Payer: PRIVATE HEALTH INSURANCE | Admitting: Licensed Clinical Social Worker

## 2013-09-26 DIAGNOSIS — F411 Generalized anxiety disorder: Secondary | ICD-10-CM

## 2013-09-29 NOTE — Progress Notes (Signed)
   THERAPIST PROGRESS NOTE  Session Time: 4:13 pm -4:59 pm  Participation Level: Active  Behavioral Response: Neat and Well GroomedAlertAnxious  Type of Therapy: Individual Therapy  Treatment Goals addressed: Anxiety, Communication: Establishing Care and Coping  Interventions: CBT, Motivational Interviewing, Strength-based and Supportive  Summary: Joshua CurryJustin A Calamia is a 28 y.o. male who presents with symptoms of anxiety and is seeking treatment for coping skills to reduce stress. [name] reported that he has a new job and now has insurance which allows him to return to therapy and medication management.  Patient has not had therapy since October 2014 and reports that he feels that his drinking has increased during that time. Patient reported that he attempted AA meetings, but it was to remain employed, not to stop drinking. Patient reported that he feels that he needs to stop, but he is not sure that he wants to stop. Patient reported that he continues with difficulty sleeping and has been prescribed Seroquel to assist in sleeping. Patient reports that he struggles with binge drinking alcohol on the weekends, smoking THC, recreational use of opioids, and gambling. Patient reports that he feels that he uses substances to cope with life stressors and he would like to learn positive coping skills to manage his stress. Patient reports that he enjoys golf and he is currently an Environmental consultantumpire for recreational baseball, which he enjoys. Patient reported that he has little natural support and that his family feels that he should be able to "get over it." Patient declined family participation in therapy. Patient reported that he would also benefit from learning life skills as he feels that he is "behind" everyone his age, which also contributes to his stress.    Suicidal/Homicidal: Nowithout intent/plan  Therapist Response: [name] appeared to be motivated to seek help, however, he may not be invested in treatment.  He appears to be in the contemplation stage of change. [name] was open to the suggestion of attending CD-IOP to receive therapy with a group of individuals who have experienced similar struggles. Patients homework was to pick and read one chapter of the AA book that he purchased and to check his schedule for a day to attend CD-IOP. Patient reported that attending AA helped him realize that he was not the only person experiencing problems with mental health and substance abuse, but he was not invested in going to meetings and reaching out for help.  Patient is aware of the benefits of joining group therapy and being invested in the therapy process and completing homework. Patient reviewed therapy expectation and confidentiality and began a treatment plan.   Plan: Return again in 1 week with dates available to attend CD-IOP and have one chapter of patients' choice in the AA book read to be discussed in the next session.   Diagnosis: Axis I: Alcohol Abuse and Generalized Anxiety Disorder    Axis II: No diagnosis    [name], Chaneka Trefz M, LCSW 09/29/2013

## 2013-10-03 ENCOUNTER — Ambulatory Visit (HOSPITAL_COMMUNITY): Payer: Self-pay | Admitting: Licensed Clinical Social Worker

## 2013-10-20 ENCOUNTER — Encounter (HOSPITAL_COMMUNITY): Payer: Self-pay | Admitting: Psychiatry

## 2013-10-20 ENCOUNTER — Ambulatory Visit (INDEPENDENT_AMBULATORY_CARE_PROVIDER_SITE_OTHER): Payer: PRIVATE HEALTH INSURANCE | Admitting: Psychiatry

## 2013-10-20 VITALS — BP 141/81 | HR 49 | Ht 72.0 in | Wt 226.0 lb

## 2013-10-20 DIAGNOSIS — F3130 Bipolar disorder, current episode depressed, mild or moderate severity, unspecified: Secondary | ICD-10-CM

## 2013-10-20 DIAGNOSIS — F1920 Other psychoactive substance dependence, uncomplicated: Secondary | ICD-10-CM

## 2013-10-20 DIAGNOSIS — F1994 Other psychoactive substance use, unspecified with psychoactive substance-induced mood disorder: Secondary | ICD-10-CM

## 2013-10-20 NOTE — Progress Notes (Signed)
[facility] [id number] Progress Note   SHERARD SUTCH [id number] 28 y.o.  10/20/2013 4:35 PM  Chief Complaint:  I don't want to stop drinking.  I like to enjoy drinking especially on the weekends.  I am doing binge drinking.          History of Present Illness:  Iban came for his followup appointment.  On his last visit we started him on Seroquel and Lamictal.  Patient is sleeping better with Seroquel .  He denies any side effects of medication.  When I ask about his drinking , he replied that he does not want to stop his drinking.  He enjoys drinking and he also admitted getting binge and intoxicated on the weekends.  He is seeing therapist who also suggested him to start AA meetings or go to CD IOP program to get some help .  Patient does not believe he has any alcohol problem I explained that psychotropic medications are not effective if he continues to drink.  He also admitted to smoking marijuana .  Patient told that he is okay not to take any medication since he enjoys drinking and using marijuana .  Patient denies any paranoia, hallucination , suicidal thoughts or homicidal thoughts.  His vitals are stable.   Suicidal Ideation: No Plan Formed: No Patient has means to carry out plan: No  Homicidal Ideation: No Plan Formed: No Patient has means to carry out plan: No  Past Psychiatric History/Hospitalization(s) Patient denies any history of suicidal attempt or any inpatient psychiatric treatment.  He admitted history of abusing drugs and alcohol at early age.  He endorses history of mania, elevated mood, impulsive behavior with excessive shopping and getting speeding tickets.  When he is very manic he loses his attention concentration.  He also endorsed highs and lows his mood.  He gets very depressed but he denies ever having suicidal thoughts.  He has seen therapist in 2011 when he broke up with his girlfriend.  Patient denies any psychosis, paranoia, hallucination, suicidal  thoughts or homicidal thoughts.  He denies any history of PTSD or OCD symptoms. Anxiety: Yes Bipolar Disorder: Yes Depression: Yes Mania: Yes Psychosis: No Schizophrenia: No Personality Disorder: No Hospitalization for psychiatric illness: No History of Electroconvulsive Shock Therapy: No Prior Suicide Attempts: No  Medical History; Patient has history of knee injury.  His primary care physician is Dr. Alroy Dust.  He has not seen him in a while. Patient has sleep study done which shows mild sleep apnea.  Review of Systems: Psychiatric: Agitation: No Hallucination: No Depressed Mood: Yes Insomnia: Yes Hypersomnia: No Altered Concentration: No Feels Worthless: No Grandiose Ideas: No Belief In Special Powers: No New/Increased Substance Abuse: Yes Compulsions: No  Neurologic: Headache: No Seizure: No Paresthesias: No    Outpatient Encounter Prescriptions as of 10/20/2013  Medication Sig  . lamoTRIgine (LAMICTAL) 25 MG tablet TAKE 1 tab daily for 1 week and than 2 tab daily  . QUEtiapine (SEROQUEL) 100 MG tablet Take 1 tablet (100 mg total) by mouth at bedtime.    No results found for this or any previous visit (from the past 2160 hour(s)).    Physical Exam: Constitutional:  BP 141/81  Pulse 49  Ht 6' (1.829 m)  Wt 226 lb (102.513 kg)  BMI 30.64 kg/m2  Musculoskeletal: Strength & Muscle Tone: within normal limits Gait & Station: normal Patient leans: N/A  Mental Status Examination;  Patient is overweight man who is  casually dressed .  He  described his mood is irritable. His affect is constricted.  He maintained fair eye contact.  His speech is clear, fluent and coherent.  His thought processes logical and goal-directed.  His fund of knowledge is adequate.  He denies any paranoia, delusional or any of the session.  He denies any active or passive suicidal thoughts or homicidal thoughts.  He denies any auditory or visual hallucination.  There are no flight of  ideas or any loose association present at this time.  His attention and concentration is fair.  Psychomotor activity isincreased.  There were no tremors or shakes.  His insight about his psychiatric illness and drug use is limited .  His impulse control is okay.    Established Problem, Stable/Improving (1), Review of Psycho-Social Stressors (1), Established Problem, Worsening (2), New Problem, with no additional work-up planned (3) and Review of Last Therapy Session (1)  Assessment: Axis I: Bipolar disorder depressed type, substance nduced mood disorder, polysubstance dependence  Axis II: Deferred  Axis III:  History reviewed. No pertinent past medical history.  Axis IV: Moderate   Plan:  I had a long discussion with the patient about his drug use and psychotropic medication.  Patient prefer not to take his medication since he enjoys drinking and smoking marijuana.  I refer him to CD IOP.  I will not prescribe any psychotropic medication at this time.  Patient need to stop drinking and smoking are 1.  Discussed in detail the risk of drug use.  Recommended to call us back if he has any questions or if he decided to stop drinking, then we would resume her psychotropic medication.  No appointment is scheduled at this time.  Time spent 25 minutes.  Discuss safety plan that anytime having active suicidal thoughts or homicidal thoughts then patient need to call 911 or go to the local emergency room.   Treshun Wold T., MD 10/20/2013

## 2013-12-15 ENCOUNTER — Ambulatory Visit
Admit: 2013-12-15 | Discharge: 2013-12-15 | Payer: PRIVATE HEALTH INSURANCE | Attending: Urology | Primary: Family Medicine

## 2013-12-15 DIAGNOSIS — R319 Hematuria, unspecified: Secondary | ICD-10-CM

## 2013-12-15 LAB — AMB POC URINALYSIS DIP STICK AUTO W/O MICRO
Bilirubin (UA POC): NEGATIVE
Glucose (UA POC): NEGATIVE
Ketones (UA POC): NEGATIVE
Nitrites (UA POC): POSITIVE
Specific gravity (UA POC): 1.015 (ref 1.001–1.035)
Urobilinogen (UA POC): 2 (ref 0.2–1)
pH (UA POC): 7 (ref 4.6–8.0)

## 2013-12-15 MED ORDER — CIPROFLOXACIN 500 MG TAB
500 mg | ORAL_TABLET | Freq: Two times a day (BID) | ORAL | Status: DC
Start: 2013-12-15 — End: 2013-12-17

## 2013-12-15 NOTE — Progress Notes (Signed)
Ethan SiasJustin J Franek  01/02/1986    Assessment:   Encounter Diagnoses   Name Primary?   ??? Gross hematuria    ??? Microscopic hematuria    ??? Urinary tract infection with hematuria, site unspecified    ??? Hematuria Yes   ??? Right flank pain    ??? History of kidney stones        We discussed the finding of hematuria and need for work up to rule out significant pathology. Patients who present with gross hematuria have an approximate 23% chance of having a urologic malignancy as the cause. In contradistinction, patients who present with microscopic hematuria have an approximate 5% chance of uncovering a urologic malignancy. Patients with gross hematuria and a negative work up have around an 18% chance of missed malignancy and therefore, I recommend some form of follow up for these individuals. Approximately 43% of patients who undergo work up for microscopic hematuria have a non-diagnostic work up. Fortunately, only a very small percentage of these patients have a missed malignancy and therefore, the follow-up can be limited.     The American Urological Association's Best Practice Policy (2012) Panel on asymptomatic microscopic hematuria defines microscopic hematuria as three or more red blood cells per high-power microscopic field in urinary sediment from a well collected urinalysis specimen.        Plan:    1. Urine for culture today  2. Ordered renal ultrasound to check for cause of hematuria and infection within the kidneys  3. Script for Cipro 500 mg given today  4. The patient will return to the office in 6 weeks for possible cystoscopy pending his culture and imaging study      Chief Complaint   Patient presents with   ??? Hematuria   ??? Flank Pain     right        History of Present Illness:  Ethan Bernard is a 28 y.o. male who presents today for right flank pain and gross hematuria.        The patient has never been seen the office before. He does not have a PCP, [place]     He reports seeing visible blood in his urine on Saturday and Sunday (12/13/13-12/14/13). He reports associated dysuria and urinary frequency every hour. He is also having mild right flank pain. KUB today is negative for stones.    He denies any history of injury through sports. No history of kidney or bladder infections. No family history of kidney stones or disease. No new sexual contacts recently. He has history of a STD about 8 years ago, none more recent than that. He quit smoking 4 months ago, denies alcohol abuse. 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.    Review of Systems  Constitutional: Fever: No;  Skin: Rash: No;  HEENT: Hearing difficulty: No;  Eyes: Blurred vision: No;  Cardiovascular: Chest pain: No;  Respiratory: Shortness of breath: No;  Gastrointestinal: Nausea/vomiting: No;  Musculoskeletal: Back pain: Yes;  Neurological: Weakness: Yes;  Psychological: Memory loss: No;  Comments/additional findings:   All other systems reviewed and are negative       History reviewed. No pertinent past medical history.    Past Surgical History   Procedure Laterality Date   ??? Hx bunionectomy       Left Foot       History   Substance Use Topics   ??? Smoking status: Former Smoker -- 0.50  packs/day     Types: Cigarettes   ??? Smokeless tobacco: Not on file   ??? Alcohol Use: 0.0 oz/week     0 Not specified per week       No Known Allergies    Family History   Problem Relation Age of Onset   ??? Hypertension Mother    ??? Hypertension Father        Current Outpatient Prescriptions   Medication Sig Dispense Refill   ??? ibuprofen (MOTRIN) 200 mg tablet Take  by mouth.     ??? ciprofloxacin HCl (CIPRO) 500 mg tablet Take 1 Tab by mouth two (2) times a day. 14 Tab 1         Physical Exam:   BP 132/80 mmHg   Ht 6' (1.829 m)   Wt 165 lb (74.844 kg)   BMI 22.37 kg/m2  Constitutional: WDWN, Pleasant and appropriate affect, No acute distress.    CV:  No peripheral swelling noted  Respiratory: No respiratory distress or difficulties   Abdomen:  No abdominal masses or tenderness. No CVA tenderness. No inguinal hernias noted.   GU Male:    UJW:[id number]:Perineum normal to visual inspection, no erythema or irritation, Sphincter with good tone, Rectum with no hemorrhoids, fissures or masses, Prostate 15-20g, smooth, symmetric and anodular  SCROTUM:  No scrotal rash or lesions noticed.  Normal bilateral testes and epididymis  PENIS: Urethral meatus normal in location and size. No urethral discharge  Skin: No jaundice. Normal color  Neuro/Psych:  Alert and oriented x 3, affect appropriate  Lymphatic:   No enlarged inguinal lymph nodes    Review of Labs and Imaging:      Results for orders placed or performed in visit on 12/15/13   AMB POC URINALYSIS DIP STICK AUTO W/O MICRO   Result Value Ref Range    Color (UA POC) Yellow     Clarity (UA POC) Clear     Glucose (UA POC) Negative Negative    Bilirubin (UA POC) Negative Negative    Ketones (UA POC) Negative Negative    Specific gravity (UA POC) 1.015 1.001 - 1.035    Blood (UA POC) 2+ Negative    pH (UA POC) 7.0 4.6 - 8.0    Protein (UA POC) 2+ Negative mg/dL    Urobilinogen (UA POC) 2 mg/dL 0.2 - 1    Nitrites (UA POC) Positive Negative    Leukocyte esterase (UA POC) 2+ Negative       A copy of today's office visit with all pertinent imaging results and labs were sent to: No primary care provider on file.    I have reviewed the family history and social history with the patient, there are no changes at this time.    Sandi RavelingNATHAN P Christopherjohn Schiele, MD on 12/15/2013    Documentation provided by Gery PrayGabrielle Grant, medical scribe for Sandi RavelingNATHAN P Randy Castrejon, MD on 12/15/2013.

## 2013-12-17 LAB — URINE C&S

## 2013-12-17 MED ORDER — TRIMETHOPRIM-SULFAMETHOXAZOLE 160 MG-800 MG TAB
160-800 mg | ORAL_TABLET | Freq: Two times a day (BID) | ORAL | Status: DC
Start: 2013-12-17 — End: 2014-01-23

## 2013-12-17 NOTE — Progress Notes (Signed)
Quick Note:        pls call pat and tell him that the cipro is not the best antibiotic    tell him to stop it and    begin:    septra DS, no 14 take bid    ______

## 2013-12-17 NOTE — Telephone Encounter (Addendum)
-----   Message from Sandi RavelingNathan P Goldin, MD sent at 12/17/2013  4:29 PM EDT -----  pls call pat and tell him  that the cipro is not the best  antibiotic  tell him to stop it and   begin:  septra DS,   no   14  take bid    LVM regarding new abx and stopping Cipro. Asked for return acknowledgement call.  Allergies verifeid by list only  Rx esent to pharmacy on file w/ note to STOP Cipro  Olan Kurek S Nicola Heinemann

## 2013-12-18 NOTE — Telephone Encounter (Signed)
Pt called in acknowledgement to abx p/u   Ethan Bernard

## 2013-12-23 ENCOUNTER — Inpatient Hospital Stay: Admit: 2013-12-23 | Payer: BLUE CROSS/BLUE SHIELD | Attending: Urology | Primary: Family Medicine

## 2013-12-23 ENCOUNTER — Ambulatory Visit
Admit: 2013-12-23 | Discharge: 2013-12-23 | Payer: PRIVATE HEALTH INSURANCE | Attending: Urology | Primary: Family Medicine

## 2013-12-23 DIAGNOSIS — R310 Gross hematuria: Secondary | ICD-10-CM

## 2013-12-23 DIAGNOSIS — R319 Hematuria, unspecified: Secondary | ICD-10-CM

## 2013-12-23 LAB — AMB POC URINALYSIS DIP STICK AUTO W/O MICRO
Bilirubin (UA POC): NEGATIVE
Blood (UA POC): NEGATIVE
Glucose (UA POC): NEGATIVE
Ketones (UA POC): NEGATIVE
Leukocyte esterase (UA POC): NEGATIVE
Nitrites (UA POC): NEGATIVE
Protein (UA POC): NEGATIVE mg/dL
Specific gravity (UA POC): 1.01 (ref 1.001–1.035)
Urobilinogen (UA POC): 0.2 (ref 0.2–1)
pH (UA POC): 6.5 (ref 4.6–8.0)

## 2013-12-23 NOTE — Progress Notes (Signed)
Ethan SiasJustin J Driver  12/17/1985    Assessment:   Encounter Diagnosis   Name Primary?   ??? Hematuria Yes       Plan:    1. The patient is scheduled to have his renal ultrasound this afternoon. Will call the patient with the results of his ultrasound.   2. The patient will return to the office on 01/23/14 for possible cystoscopy      Chief Complaint   Patient presents with   ??? Hematuria       History of Present Illness:  Ethan Bernard is a 28 y.o. male who presents today for gross hematuria.        The patient was last seen in the office for the first time on 12/15/13 with complaints of visible blood in his urine on Saturday and Sunday (12/13/13-12/14/13). He also reported associated dysuria and urinary frequency every hour, and mild right flank pain. KUB that day 12/15/13 was negative for stones. His urine was positive for blood, nitrites, and leukocytes at that time and was sent for culture. The culture showed greater than 100,000 cfu/ml E. Coli. He was started on bactrim. The patient's urine is negative for blood today. He is complaining of abdominal/lower back pain, slight fever, and chills today.    He is scheduled to have a renal ultrasound this afternoon at 3 PM.    Review of Systems  Constitutional: Fever: Yes;  Skin: Rash: No;  HEENT: Hearing difficulty: No;  Eyes: Blurred vision: No;  Cardiovascular: Chest pain: Yes;  Respiratory: Shortness of breath: No;  Gastrointestinal: Nausea/vomiting: No;  Musculoskeletal: Back pain: No;  Neurological: Weakness: Yes;  Psychological: Memory loss: No;  Comments/additional findings:   All other systems reviewed and are negative       Past Medical History   Diagnosis Date   ??? Hematuria    ??? UTI (lower urinary tract infection)        Past Surgical History   Procedure Laterality Date   ??? Hx bunionectomy       Left Foot       History   Substance Use Topics   ??? Smoking status: Former Smoker -- 0.50 packs/day     Types: Cigarettes   ??? Smokeless tobacco: Not on file    ??? Alcohol Use: 0.0 oz/week     0 Not specified per week       No Known Allergies    Family History   Problem Relation Age of Onset   ??? Hypertension Mother    ??? Hypertension Father        Current Outpatient Prescriptions   Medication Sig Dispense Refill   ??? trimethoprim-sulfamethoxazole (BACTRIM DS, SEPTRA DS) 160-800 mg per tablet Take 1 Tab by mouth two (2) times a day. STOP Cipro 14 Tab 0   ??? ibuprofen (MOTRIN) 200 mg tablet Take  by mouth.           Physical Exam:   BP 118/68 mmHg   Temp(Src) 99.5 ??F (37.5 ??C)   Ht 6' (1.829 m)   Wt 165 lb (74.844 kg)   BMI 22.37 kg/m2  Constitutional: WDWN, Pleasant and appropriate affect, No acute distress.        Review of Labs and Imaging:      Results for orders placed or performed in visit on 12/23/13   AMB POC URINALYSIS DIP STICK AUTO W/O MICRO   Result Value Ref Range    Color (UA POC) Yellow     Clarity (  UA POC) Clear     Glucose (UA POC) Negative Negative    Bilirubin (UA POC) Negative Negative    Ketones (UA POC) Negative Negative    Specific gravity (UA POC) 1.010 1.001 - 1.035    Blood (UA POC) Negative Negative    pH (UA POC) 6.5 4.6 - 8.0    Protein (UA POC) Negative Negative mg/dL    Urobilinogen (UA POC) 0.2 mg/dL 0.2 - 1    Nitrites (UA POC) Negative Negative    Leukocyte esterase (UA POC) Negative Negative       A copy of today's office visit with all pertinent imaging results and labs were sent to: None    I have reviewed the family history and social history with the patient, there are no changes at this time.    Sandi RavelingNATHAN P Rajinder Mesick, MD on 12/23/2013    Documentation provided by Gery PrayGabrielle Grant, medical scribe for Sandi RavelingNATHAN P Jeven Topper, MD on 12/23/2013.

## 2013-12-24 ENCOUNTER — Encounter: Attending: Urology | Primary: Family Medicine

## 2013-12-24 NOTE — Progress Notes (Signed)
Quick Note:        tell pat tht us is normal    ______

## 2013-12-25 NOTE — Telephone Encounter (Signed)
Patient in;formed of his US results.    Sandi RavelingNathan P Goldin, MD  Montine CircleMichelle S Toney                tell pat tht us is normal

## 2013-12-26 ENCOUNTER — Encounter (HOSPITAL_COMMUNITY): Payer: Self-pay | Admitting: Licensed Clinical Social Worker

## 2014-01-23 ENCOUNTER — Ambulatory Visit
Admit: 2014-01-23 | Discharge: 2014-01-23 | Payer: PRIVATE HEALTH INSURANCE | Attending: Urology | Primary: Family Medicine

## 2014-01-23 DIAGNOSIS — R319 Hematuria, unspecified: Secondary | ICD-10-CM

## 2014-01-23 LAB — AMB POC URINALYSIS DIP STICK AUTO W/O MICRO
Bilirubin (UA POC): NEGATIVE
Blood (UA POC): NEGATIVE
Glucose (UA POC): NEGATIVE
Ketones (UA POC): NEGATIVE
Leukocyte esterase (UA POC): NEGATIVE
Nitrites (UA POC): NEGATIVE
Protein (UA POC): NEGATIVE mg/dL
Specific gravity (UA POC): 1.005 (ref 1.001–1.035)
Urobilinogen (UA POC): 0.2 (ref 0.2–1)
pH (UA POC): 6 (ref 4.6–8.0)

## 2014-01-23 NOTE — Progress Notes (Signed)
Ethan Bernard  03/16/1985    Assessment:   Encounter Diagnosis   Name Primary?   ??? Hematuria Yes       Plan:    1. Renal ultrasound report 12/23/13 reviewed with the patient today  2. Symptoms were as a result of infection.  3. Will hold off on cystoscopy today. Renal ultrasound shows no cause of hematuria and UA is negative for blood today. Patient is asymptomatic now.  4. The patient will return to the office in 1 year for follow up.      Chief Complaint   Patient presents with   ??? Hematuria     possible cysto       History of Present Illness:  Ethan Bernard is a 28 y.o. male who presents today for hematuria.        ?? First seen on 10/26, UA showed positive nitrites, moderate blood and moderate leukocytes  ?? Completed full course of Bactrim, has not seen any blood in his urine since then  ?? Urination is back to normal now  ?? Patient is asymptomatic for kidney stone symptoms today. No back or flank pain today      Renal ultrasound 12/23/13 report reviewed:  FINDINGS:  Real time transverse and sagittal images have been obtained.   The right kidney measures 9.5 x 5.4 x 4.7 cm. The echogenicity of the right  kidney is normal. There is no hydronephrosis.   The left kidney measures 10.4 x 5.9 x 4.5 cm. The echogenicity of the left  kidney is normal. There is no hydronephrosis.   Portions of the liver and spleen are demonstrated and are normal. Shadowing  calculi are seen within the gallbladder. The urinary bladder is moderately  distended. The wall of the urinary bladder is not thickened. The prostate  measures 4.6 x 3.4 x 3.1 cm.  IMPRESSION:  Negative ultrasound of the urinary system.  Nonobstructing cholelithiasis without secondary signs of acute cholecystitis..      AUA Assessment Score:  AUA Score: 4;    AUA Bother Rating:        Review of Systems  Constitutional: Fever: No  Skin: Rash: No  HEENT: Hearing difficulty: No  Eyes: Blurred vision: No  Cardiovascular: Chest pain: No   Respiratory: Shortness of breath: No  Gastrointestinal: Nausea/vomiting: No  Musculoskeletal: Back pain: No  Neurological: Weakness: No  Psychological: Memory loss: No  Comments/additional findings:   All other systems reviewed and are negative       Past Medical History   Diagnosis Date   ??? Hematuria    ??? UTI (lower urinary tract infection)        Past Surgical History   Procedure Laterality Date   ??? Hx bunionectomy       Left Foot       History   Substance Use Topics   ??? Smoking status: Former Smoker -- 0.50 packs/day     Types: Cigarettes   ??? Smokeless tobacco: Not on file   ??? Alcohol Use: 0.0 oz/week     0 Not specified per week       No Known Allergies    Family History   Problem Relation Age of Onset   ??? Hypertension Mother    ??? Hypertension Father              Physical Exam:   BP 118/72 mmHg   Ht 6' (1.829 m)   Wt 165 lb (74.844 kg)   BMI 22.37  kg/m2  Constitutional: WDWN, Pleasant and appropriate affect, No acute distress.    CV:  No peripheral swelling noted  Respiratory: No respiratory distress or difficulties  Abdomen:  No abdominal masses or tenderness. No CVA tenderness. No inguinal hernias noted.   Skin: No jaundice. Normal color  Neuro/Psych:  Alert and oriented x 3, affect appropriate  Lymphatic:   No enlarged inguinal lymph nodes    Review of Labs and Imaging:      Results for orders placed or performed in visit on 01/23/14   AMB POC URINALYSIS DIP STICK AUTO W/O MICRO   Result Value Ref Range    Color (UA POC) Yellow     Clarity (UA POC) Clear     Glucose (UA POC) Negative Negative    Bilirubin (UA POC) Negative Negative    Ketones (UA POC) Negative Negative    Specific gravity (UA POC) 1.005 1.001 - 1.035    Blood (UA POC) Negative Negative    pH (UA POC) 6.0 4.6 - 8.0    Protein (UA POC) Negative Negative mg/dL    Urobilinogen (UA POC) 0.2 mg/dL 0.2 - 1    Nitrites (UA POC) Negative Negative    Leukocyte esterase (UA POC) Negative Negative        A copy of today's office visit with all pertinent imaging results and labs were sent to: None    I have reviewed the family history and social history with the patient, there are no changes at this time.    Sandi RavelingNATHAN P Jaydalee Bardwell, MD on 01/23/2014    Documentation provided by Gery PrayGabrielle Grant, medical scribe for Sandi RavelingNATHAN P Halia Franey, MD on 01/23/2014.

## 2014-05-30 ENCOUNTER — Inpatient Hospital Stay
Admit: 2014-05-30 | Discharge: 2014-05-30 | Disposition: A | Discharge status: Home / Self Care | Payer: BLUE CROSS/BLUE SHIELD | Attending: Emergency Medicine

## 2014-05-30 DIAGNOSIS — R109 Unspecified abdominal pain: Secondary | ICD-10-CM

## 2014-05-30 LAB — POC URINE MACROSCOPIC
Bilirubin: NEGATIVE
Blood: NEGATIVE
Glucose: NEGATIVE mg/dl
Ketone: NEGATIVE mg/dl
Leukocyte Esterase: NEGATIVE
Nitrites: NEGATIVE
Protein: NEGATIVE mg/dl
Specific gravity: 1.015 (ref 1.005–1.030)
Urobilinogen: 0.2 EU/dl (ref 0.0–1.0)
pH (UA): 7 (ref 5–9)

## 2014-05-30 MED ORDER — METHOCARBAMOL 500 MG TAB
500 mg | ORAL_TABLET | Freq: Four times a day (QID) | ORAL | Status: DC
Start: 2014-05-30 — End: 2014-12-18

## 2014-05-30 MED ORDER — NAPROXEN 500 MG TAB
500 mg | ORAL_TABLET | Freq: Two times a day (BID) | ORAL | Status: DC
Start: 2014-05-30 — End: 2014-05-30

## 2014-05-30 MED ORDER — NAPROXEN 500 MG TAB
500 mg | ORAL_TABLET | Freq: Two times a day (BID) | ORAL | Status: AC
Start: 2014-05-30 — End: 2014-06-09

## 2014-05-30 NOTE — ED Notes (Signed)
Pt c/o R side pain since Tuesday. Pt denies hx of kidney stones. Pt denies any symptoms of dysuria. -N/V/D

## 2014-05-30 NOTE — ED Notes (Signed)
I have reviewed discharge instructions with the patient and spouse.  The patient and spouse verbalized understanding.

## 2014-05-30 NOTE — ED Provider Notes (Signed)
[facility]  EMERGENCY DEPARTMENT TREATMENT REPORT  NAME:  Ethan Bernard  SEX:   M  ADMIT: 05/30/2014  DOB:   11-30-1985  MR#    [id number]  ROOM:  AV40  TIME DICTATED: 06 36 PM  ACCT#  [id number]    cc: Aundra Millet Germscheid DO    TIME OF EXAMINATION:  5:01 p.m.    PRIMARY CARE DOCTOR:  Megan Germscheid, DO    CHIEF COMPLAINT:  Right flank pain.    HISTORY OF PRESENT ILLNESS:  The patient is a 29 year old African American male presenting with right flank   pain.  He states that he has been having this sharp stabbing pain in his   right flank region for the last couple of days, about 3 or 4 days now.  He   said last time something like this happen he winded up having a urinary tract   infection.  He had blood in his urine and stuff like that.  He was seen by   urologist over at [facility].  They first treated with Cipro and then was called   and told the Cipro was not going to work so they switched him to Bactrim and   it eventually resolved.  This was back and around October or November 2015   time.  Since then he has had no difficulties.  Since his pain started, he has   been having the pain, but having no other symptoms.  No dysuria, no frequency,   no urgency, no hematuria, no nausea, vomiting, diarrhea or constipation.  No   shortness of breath, no difficulty breathing.  Does not remember injuring   himself in any way.  He has not done any  lifting, has not any excessive   exercising or anything like that along the lines.  Girlfriend was reading   online and was worried about possible appendicitis, but he has no abdominal   pain at all.  No other signs or symptoms or complaints at this time.    REVIEW OF SYSTEMS:  CONSTITUTIONAL:  No fever, chills, or weight loss.  RESPIRATORY:  No cough, shortness of breath or wheezing.  CARDIOVASCULAR:  No chest pain, chest pressure, or palpitations.   GASTROINTESTINAL:  No vomiting, no diarrhea, no nausea.  No constipation.     Positive right flank pain, no abdominal pain.  No CVA pain.  MUSCULOSKELETAL:  No back pain.  No joint pain.  No swelling.   INTEGUMENTARY:  No rashes.  NEUROLOGICAL:  No headaches, sensory or motor symptoms.   PSYCHIATRIC:  No suicidal or homicidal ideation.  Denies complaints in all other symptoms.     PAST MEDICAL HISTORY:  Hematuria and urinary tract infection.    PAST SURGICAL HISTORY:  Bunionectomy on the left foot.    FAMILY HISTORY:  Father and mother both have hypertension.    SOCIAL HISTORY:  He is a former smoker.  He use to smoke about a half pack a day.  Alcohol is   socially.  Denies drug use.    ALLERGIES:  NO ALLERGIES TO MEDICATIONS.    MEDICATIONS:  Not currently taking any medications.    PHYSICAL EXAMINATION  VITAL SIGNS:  Temperature is 98.5 degrees Fahrenheit, pulse 72, respirations   16, blood pressure 128/89 with a MAP 102, SpO2 is 100% on room air.  He is 6   foot and 165 pounds.    PHYSICAL EXAMINATION:  GENERAL APPEARANCE:  Patient appears well developed and well nourished.  Appearance and behavior are age and situation appropriate.  RESPIRATORY:  Clear and equal breath sounds.  No respiratory distress,   tachypnea, or accessory muscle use.    CARDIOVASCULAR:  Heart regular, without murmurs, gallops, rubs or thrills.     DP pulses 2+ and equal bilaterally.  GI:  Abdomen soft, nontender, without complaint of pain to palpation.  No   hepatomegaly or splenomegaly.  Point specific region that he is tender to   palpation in the right lateral flank area.  Does not extend to the abdominal   region.  Does not extend to the back region.  It is all in the abdominal   muscle wall and right flank.    MUSCULOSKELETAL:   Stance and gait appear normal.   No CVA tenderness to   palpation bilaterally.  SKIN:  Warm and dry without rashes.  NEUROLOGIC:  Alert, oriented.  Sensation intact, motor strength equal and   symmetric.  PSYCHIATRIC:  Judgment appears appropriate.     INITIAL ASSESSMENT:     The patient is sitting on stretcher.  Does not look to be in any acute   distress at all, smiling throughout exam.  Girlfriend is quiet for most of the   time and until I came back later and talked to her.  She was the one that was   discussing and worrisome for appendicitis.      DIFFERENTIAL DIAGNOSES:  Abdominal pain, flank pain, abdominal muscle strain, back pain, urinary tract   infection, cystitis, renal colic, renal calculi, pyelonephritis.  Labs ordered   here were a urinalysis which was completely normal.  No diagnostic studies   done here.  No medications given here.      MDM:  This is a minor care patient.    SECONDARY ASSESSMENT:  Pain has not changed.  Discussed lab results with patient.  Girlfriend seemed   a little upset we were not going to do an  ultrasound or CT.  Advised that   there is really no need for it at this time.  He has no abdominal pain.  He   has no back pain.  He is point-specific where he is tender and is not likely   to be appendicitis which she was worried about.  It is not likely to be   anything in the kidney because of where he is actually tender.  She voiced   understanding.  Dr. Floyce Stakesodd Ambers Iyengar also discuss with them and they seem to be   okay with the discharge plan.    DIAGNOSIS:  Right flank abdominal muscle pain.    DISPOSITION:  The patient sent home on Naprosyn and Robaxin.  Advised to apply moist heat   and do some stretching exercises.  Also advised him to follow up with his   primary care doctor in 1 week if no improvement.  Advised to drink a lot of   fluids.  Discharge instructions given.  The patient is discharged home in   stable condition, with instructions to follow up with their regular doctor.    They are advised to return immediately for any worsening or symptoms of   concern.    The patient was personally evaluated by myself and Dr. Floyce Stakesodd Jabre Heo  who agrees   with the above assessment and plan.    CONTINUATION BY DR Emmeline Winebarger Jimmey RalphPARKER   I personally evaluated the patient and agree.  He has sharp, intermittent, localized pain on his lateral pelvis which appears to  be muscular in nature.  He has no abdominal tenderness, no tenderness at McBurney's point, no CVA tenderness, and frankly a very benign exam.  While I am not certain of the etiology of his pain, at this time there is no evidence of emergent pathology.  Advanced imaging is not indicated in the emergency department.  We encouraged him to follow up ASAP as an outpatient for continued evaluation if his symptoms continue, and to return immediately to the ER for any change or worsening, or any of the signs we advised him of that would indicate a more emergent pathology.      ___________________  Johny Drilling MD  Dictated By: Leamon Arnt, PA    My signature above authenticates this document and my orders, the final  diagnosis (es), discharge prescription (s), and instructions in the [facility] record.  Nursing notes have been reviewed by the physician/mid-level provider.    If you have any questions please contact [phone].    BB  D:05/30/2014 18:36:06  T: 05/30/2014 19:37:55  [id number]

## 2014-08-26 ENCOUNTER — Inpatient Hospital Stay: Admit: 2014-08-26 | Payer: BLUE CROSS/BLUE SHIELD | Attending: Family Medicine | Primary: Family Medicine

## 2014-08-26 ENCOUNTER — Encounter

## 2014-08-26 DIAGNOSIS — M25552 Pain in left hip: Secondary | ICD-10-CM

## 2014-12-18 ENCOUNTER — Ambulatory Visit
Admit: 2014-12-18 | Discharge: 2014-12-18 | Payer: PRIVATE HEALTH INSURANCE | Attending: Urology | Primary: Family Medicine

## 2014-12-18 DIAGNOSIS — R350 Frequency of micturition: Secondary | ICD-10-CM

## 2014-12-18 LAB — AMB POC PVR, MEAS,POST-VOID RES,US,NON-IMAGING: PVR: 0 cc

## 2014-12-18 LAB — AMB POC URINALYSIS DIP STICK AUTO W/O MICRO
Bilirubin (UA POC): NEGATIVE
Blood (UA POC): NEGATIVE
Glucose (UA POC): NEGATIVE
Ketones (UA POC): NEGATIVE
Leukocyte esterase (UA POC): NEGATIVE
Nitrites (UA POC): NEGATIVE
Protein (UA POC): NEGATIVE mg/dL
Specific gravity (UA POC): 1.02 (ref 1.001–1.035)
Urobilinogen (UA POC): 0.2 (ref 0.2–1)
pH (UA POC): 5.5 (ref 4.6–8.0)

## 2014-12-18 NOTE — Progress Notes (Signed)
Ethan Bernard  02/20/1985          Assessment:     1. Suprapubic pain   RUS 12/23/2013 reviewed - Negative ultrasound of the urinary system.   Urinalysis dipstick today - negative   2. Gross hematuria  3. H/o E. Coli UTI      Plan:    1. Recommend patient continue to take pain medications for pain.   2. Will schedule for cystoscopy to further evaluate for urinary symptoms and suprapubic pain     Chief Complaint   Patient presents with   ??? Urinary Odor     Pt states he has had his symptoms "on and off for a few months"   ??? Abdominal Pain       History of Present Illness:  Ethan SiasJustin J Bernard is a 29 y.o. male who presents today for evaluation of suprapubic pain. He was previously followed by Dr. Lawson FiscalGoldin for hematuria.     Presents today with suprapubic pain. States pain has been intermittent over the past two months.   Denies gross hematuria dysuria.   Reports intermittent weak FOS, states his bladder sometimes feel full.   Notes some intermittent hesitancy.   Reports clear penile discharge during defecation.   No diarrhea    Denies f/n/v/c. No flank pain.    Notes Hx of UTI  No hx of kidney stones.   Notes hx of Sexually transmitted diseases, chlamydia about 8-9 years ago.   Reports one sexual partner.     Renal ultrasound 12/23/13 report reviewed:  FINDINGS:  Real time transverse and sagittal images have been obtained.   The right kidney measures 9.5 x 5.4 x 4.7 cm. The echogenicity of the right  kidney is normal. There is no hydronephrosis.   The left kidney measures 10.4 x 5.9 x 4.5 cm. The echogenicity of the left  kidney is normal. There is no hydronephrosis.   Portions of the liver and spleen are demonstrated and are normal. Shadowing  calculi are seen within the gallbladder. The urinary bladder is moderately  distended. The wall of the urinary bladder is not thickened. The prostate  measures 4.6 x 3.4 x 3.1 cm.  IMPRESSION:  Negative ultrasound of the urinary system.   Nonobstructing cholelithiasis without secondary signs of acute cholecystitis..      AUA Assessment Score:  AUA Score: 9;    AUA Bother Rating: Mostly dissatisfied      Review of Systems  Constitutional: Fever: No  Skin: Rash: No  HEENT: Hearing difficulty: No  Eyes: Blurred vision: No  Cardiovascular: Chest pain: No  Respiratory: Shortness of breath: No  Gastrointestinal: Nausea/vomiting: No  Musculoskeletal: Back pain: No  Neurological: Weakness: No  Psychological: Memory loss: No  Comments/additional findings:   All other systems reviewed and are negative       Past Medical History   Diagnosis Date   ??? Hematuria    ??? UTI (lower urinary tract infection)        Past Surgical History   Procedure Laterality Date   ??? Hx bunionectomy       Left Foot       Social History   Substance Use Topics   ??? Smoking status: Former Smoker     Packs/day: 0.50     Types: Cigarettes   ??? Smokeless tobacco: None   ??? Alcohol use 0.0 oz/week     0 Standard drinks or equivalent per week      Comment: socially  No Known Allergies    Family History   Problem Relation Age of Onset   ??? Hypertension Mother    ??? Hypertension Father              Physical Exam:   Visit Vitals   ??? BP 123/79   ??? Ht 6' (1.829 m)   ??? Wt 165 lb (74.8 kg)   ??? BMI 22.38 kg/m2     Constitutional: WDWN, Pleasant and appropriate affect, No acute distress.    CV:  No peripheral swelling noted  Respiratory: No respiratory distress or difficulties  Abdomen:  No abdominal masses or tenderness. No CVA tenderness. No inguinal hernias noted.   GU Male:    SCROTUM:  No scrotal rash or lesions noticed.  Normal bilateral testes and epididymis. Grade 2 left sided varicocele   PENIS: Urethral meatus normal in location and size. No urethral discharge.  Skin: No jaundice. Normal color  Neuro/Psych:  Alert and oriented x 3, affect appropriate  Lymphatic:   No enlarged inguinal lymph nodes    Review of Labs and Imaging:      Results for orders placed or performed in visit on 12/18/14    AMB POC PVR, MEAS,POST-VOID RES,US,NON-IMAGING   Result Value Ref Range    PVR 0 cc   AMB POC URINALYSIS DIP STICK AUTO W/O MICRO   Result Value Ref Range    Color (UA POC) Yellow     Clarity (UA POC) Cloudy     Glucose (UA POC) Negative Negative    Bilirubin (UA POC) Negative Negative    Ketones (UA POC) Negative Negative    Specific gravity (UA POC) 1.020 1.001 - 1.035    Blood (UA POC) Negative Negative    pH (UA POC) 5.5 4.6 - 8.0    Protein (UA POC) Negative Negative mg/dL    Urobilinogen (UA POC) 0.2 mg/dL 0.2 - 1    Nitrites (UA POC) Negative Negative    Leukocyte esterase (UA POC) Negative Negative       A copy of today's office visit with all pertinent imaging results and labs were sent to: Kirkland Hun, DO    I have reviewed the family history and social history with the patient, there are no changes at this time.    Karie Fetch, MD on 12/18/2014    This chart was documented with the assistance of Wonda Horner, medical scribe for Karie Fetch, MD on 12/18/2014.

## 2014-12-24 ENCOUNTER — Ambulatory Visit
Admit: 2014-12-24 | Discharge: 2014-12-24 | Payer: PRIVATE HEALTH INSURANCE | Attending: Urology | Primary: Family Medicine

## 2014-12-24 DIAGNOSIS — R319 Hematuria, unspecified: Secondary | ICD-10-CM

## 2014-12-24 LAB — AMB POC URINALYSIS DIP STICK AUTO W/O MICRO
Bilirubin (UA POC): NEGATIVE
Blood (UA POC): NEGATIVE
Glucose (UA POC): NEGATIVE
Ketones (UA POC): NEGATIVE
Leukocyte esterase (UA POC): NEGATIVE
Nitrites (UA POC): NEGATIVE
Protein (UA POC): NEGATIVE mg/dL
Specific gravity (UA POC): 1.01 (ref 1.001–1.035)
Urobilinogen (UA POC): 0.2 (ref 0.2–1)
pH (UA POC): 6.5 (ref 4.6–8.0)

## 2014-12-24 NOTE — Progress Notes (Signed)
CYSTOSCOPY PROCEDURE    Patient Name: Ethan Bernard            Date of Procedure: 12/24/2014     Procedure: Cystoscopy    This procedure has been fully reviewed with the patient, and written informed consent has been obtained.    Procedure:  The patient was placed in the lithotomy position, and prepped and draped in the normal fashion. 5 ml of 4% Lidocaine gel was placed in the urethra. Once adequate anesthesia was achieved; the flexible cystoscope was placed into the bladder.    History / Imaging:       Ethan SiasJustin J Sliker presents today for cystoscopy for suprapubic pain.       Exam:    Lidocaine Jelly: Yes            Scope: Flexible Scope  Meatus: Normal  Urethra: Normal  Prostate: non-obstructing  Bladder neck: open  Trigone: Normal  Trabeculation:normal  Diverticuli: No  Lesion: none    Lab / Imaging:   Results for orders placed or performed in visit on 12/24/14   AMB POC URINALYSIS DIP STICK AUTO W/O MICRO   Result Value Ref Range    Color (UA POC) Yellow     Clarity (UA POC) Clear     Glucose (UA POC) Negative Negative    Bilirubin (UA POC) Negative Negative    Ketones (UA POC) Negative Negative    Specific gravity (UA POC) 1.010 1.001 - 1.035    Blood (UA POC) Negative Negative    pH (UA POC) 6.5 4.6 - 8.0    Protein (UA POC) Negative Negative mg/dL    Urobilinogen (UA POC) 0.2 mg/dL 0.2 - 1    Nitrites (UA POC) Negative Negative    Leukocyte esterase (UA POC) Negative Negative        Impression:  Normal cystoscopy    Plan:    See Office Visit note    Danh Bayus, MD    This chart was documented with the assistance of Wonda Hornerarine Binyam, medical scribe for Karie Fetchaman Amika Tassin, MD on 12/24/2014.

## 2014-12-24 NOTE — Progress Notes (Signed)
Ethan Bernard  Jun 26, 1985          Assessment:     1. Suprapubic pain   RUS 12/23/2013 reviewed - Negative ultrasound of the urinary system.   Urinalysis dipstick 12/18/2014 - negative    Cystoscopy today 12/24/2014 - normal    2. Hx of Gross hematuria  3. Hx of  E. Coli UTI      Plan:    1. Cystoscopy today was normal. Will refer to physical therapy for chronic pelvic pain.     Chief Complaint   Patient presents with   ??? Cystoscopy     procedure       History of Present Illness:  Ethan Bernard is a 29 y.o. male who presents today for cystoscopy in follow up for suprapubic pain and urinary symptoms. He was previously followed by Dr. Lawson Fiscal for hematuria. Patient initially presented on 12/18/2014 with intermittent suprapubic pain ongoing for two months. He denied gross hematuria, dysuria at the time. He reported intermittent weak FOS, stated his bladder sometimes feel full. Noted some intermittent hesitancy. Reported clear penile discharge during defecation. No diarrhea. Denied f/n/v/c. No flank pain.    Patient is doing well today. No new voiding complaints. Symptoms remain stable.    Notes Hx of UTI  No hx of kidney stones.   Notes hx of Sexually transmitted diseases, chlamydia about 8-9 years ago.   Reports one sexual partner.     Renal ultrasound 12/23/13 report reviewed:  FINDINGS:  Real time transverse and sagittal images have been obtained.   The right kidney measures 9.5 x 5.4 x 4.7 cm. The echogenicity of the right  kidney is normal. There is no hydronephrosis.   The left kidney measures 10.4 x 5.9 x 4.5 cm. The echogenicity of the left  kidney is normal. There is no hydronephrosis.   Portions of the liver and spleen are demonstrated and are normal. Shadowing  calculi are seen within the gallbladder. The urinary bladder is moderately  distended. The wall of the urinary bladder is not thickened. The prostate  measures 4.6 x 3.4 x 3.1 cm.  IMPRESSION:  Negative ultrasound of the urinary system.   Nonobstructing cholelithiasis without secondary signs of acute cholecystitis..      AUA Assessment Score:    AUA Symptom Score 12/24/2014   Over the past month how often have you had the sensation that your bladder was not completely empty after you finished urinating? 1   Over the past month, how often have had to urinate again less than 2 hours after you last finished urinating? 1   Over the past month, how often have you found you stopped and started again several times when you urinated? 0   Over the past month, how often have you found it difficult to postpone urination? 1   Over the past month, how often have you had a weak urinary stream? 2   Over the past month, how often have you had to push or strain to begin urinating? 0   Over the past month, how many times did you most typically get up to urinate from the time you went to bed at night until the time you got up in the morning? 2   AUA Score 7   If you were to spend the rest of your life with your urinary condition the way it is now, how would you feel about that? -         Review of Systems  Constitutional: Fever: No  Skin: Rash: No  HEENT: Hearing difficulty: No  Eyes: Blurred vision: No  Cardiovascular: Chest pain: No  Respiratory: Shortness of breath: No  Gastrointestinal: Nausea/vomiting: No  Musculoskeletal: Back pain: No  Neurological: Weakness: No  Psychological: Memory loss: No  Comments/additional findings:   All other systems reviewed and are negative       Past Medical History   Diagnosis Date   ??? Hematuria    ??? UTI (lower urinary tract infection)        Past Surgical History   Procedure Laterality Date   ??? Hx bunionectomy       Left Foot       Social History   Substance Use Topics   ??? Smoking status: Former Smoker     Packs/day: 0.50     Types: Cigarettes   ??? Smokeless tobacco: None   ??? Alcohol use 0.0 oz/week     0 Standard drinks or equivalent per week      Comment: socially       No Known Allergies    Family History    Problem Relation Age of Onset   ??? Hypertension Mother    ??? Hypertension Father              Physical Exam:   Visit Vitals   ??? BP 150/80   ??? Ht 6' (1.829 m)   ??? Wt 165 lb (74.8 kg)   ??? BMI 22.38 kg/m2     Constitutional: WDWN, Pleasant and appropriate affect, No acute distress.    CV:  No peripheral swelling noted  Respiratory: No respiratory distress or difficulties  Skin: No jaundice. Normal color  Neuro/Psych:  Alert and oriented x 3, affect appropriate  Lymphatic:   No enlarged inguinal lymph nodes    Review of Labs and Imaging:      Results for orders placed or performed in visit on 12/24/14   AMB POC URINALYSIS DIP STICK AUTO W/O MICRO   Result Value Ref Range    Color (UA POC) Yellow     Clarity (UA POC) Clear     Glucose (UA POC) Negative Negative    Bilirubin (UA POC) Negative Negative    Ketones (UA POC) Negative Negative    Specific gravity (UA POC) 1.010 1.001 - 1.035    Blood (UA POC) Negative Negative    pH (UA POC) 6.5 4.6 - 8.0    Protein (UA POC) Negative Negative mg/dL    Urobilinogen (UA POC) 0.2 mg/dL 0.2 - 1    Nitrites (UA POC) Negative Negative    Leukocyte esterase (UA POC) Negative Negative       A copy of today's office visit with all pertinent imaging results and labs were sent to: Kirkland HunMegan Germscheid, DO    I have reviewed the family history and social history with the patient, there are no changes at this time.    Karie Fetchaman Caris Cerveny, MD on 12/24/2014    This chart was documented with the assistance of Wonda Hornerarine Binyam, medical scribe for Karie Fetchaman Maram Bently, MD on 12/24/2014.

## 2014-12-24 NOTE — Progress Notes (Signed)
Tedd SiasJustin J Yarde is a 29 y.o. male who is here today per the order of Dr. Doreene BurkeUnnikrishnan to receive Gentamicin.   Patient's identity has been verified.   Dr. Doreene BurkeUnnikrishnan was available in the clinic as incident to provider.     Gentamicin is administered to gluteus ( right) IM without difficulty.       Patient tolerated the injection well.      Orders Placed This Encounter   ??? AMB POC URINALYSIS DIP STICK AUTO W/O MICRO       Patient did not supply own medication.     Selena BattenLachrisha Casee Knepp

## 2015-02-02 ENCOUNTER — Institutional Professional Consult (permissible substitution)
Admit: 2015-02-02 | Discharge: 2015-02-02 | Payer: PRIVATE HEALTH INSURANCE | Attending: Rehabilitative and Restorative Service Providers" | Primary: Family Medicine

## 2015-02-02 DIAGNOSIS — M62838 Other muscle spasm: Secondary | ICD-10-CM

## 2015-02-03 NOTE — Progress Notes (Signed)
[facility] Physical Therapy Evaluation    Ethan Bernard, 29 y.o., , male presents today for evaluation.    Past Medical History   Diagnosis Date   ??? Hematuria    ??? UTI (lower urinary tract infection)       Dr. Shelda Altes    Plan:    1. Cystoscopy today was normal. Will refer to physical therapy for chronic pelvic pain.   ??       Chief Complaint   Patient presents with   ??? Cystoscopy   ?? ?? procedure   ??  ??  History of Present Illness: Ethan Bernard is a 29 y.o. male who presents today for cystoscopy in follow up for suprapubic pain and urinary symptoms. He was previously followed by Dr. Donovan Kail for hematuria. Patient initially presented on 12/18/2014 with intermittent suprapubic pain ongoing for two months. He denied gross hematuria, dysuria at the time. He reported intermittent weak FOS, stated his bladder sometimes feel full. Noted some intermittent hesitancy. Reported clear penile discharge during defecation. No diarrhea. Denied f/n/v/c. No flank pain.  ??    Today- c/o suprapubic/ groin pain- started about 3-4 months ago    Social History  workingTechnical brewer at [employer]  Recently - wife had baby      Current urinary complaint  None    Bowel function:  Regular BM, 5-7 per week    Pain complaint:  suprapubic pain/discomfort  lower abdomen    Pain scale:  Verbal Analog scale: average daily pain 3, best day 1, worst pain 5    Pain description:  Quality:  dull and burining  Behavior:  intermittent  Worsens with: sitting    Functional limitations:  cannot get a restful nights sleep    Physical Exam    Pelvic girdle alignment:  Pelvic alignment assessed in stance, supine and prone. Pt asked to perform weight shift activities, spinal flexion and extension to assess pelvic stability with movement. It was noted that patient had poor WS with decreased right/ left sided ilial stability, decreased ability to maintain single leg stance on  left side.      Patient was instructed is self assessment and was able to palpate and identify malalignment. Pt was instructed in principles of muscle energy technique (MET) to address pelvic malalignment for left posterior rotation. Pt effectively performed self MET with subsequent reassessment and noted improved pelvic alignment. Pt will perform both self assessment and MET for home program daily.        External Palpation:  left iliopsoas  right quadratus lumborum  right gluteals     Postural assessment:  sitting posture with posterior pelvic tilt, weight distribution through sacrum  sitting with rounded shoulders, hyperextension of cervical spine  stance with unequal weight through LE's    Range of Motion and flexibility:  Patient with significant low back ROM restriction and accommodates with postural and gait changes      Treatment today:  Initiated pt education including anatomy and physiology of pelvic floor and it's relevance to  symptoms, pain.  Initiated pt education including lifestyle modifications pertinent to management of his condition.   Initiated treatment techniques, including manual correction of pelvic asymmetries  Initiated HEP, to assist pt in taking an active role in rehabilitation and progress towards goals.       Assessment    Ethan Bernard presents with the following behavioral/functional findings:   Advanced ADL's limited due to pain  Fitness exercise tolerance limited due to  pain    Ethan Bernard presents with the following findings for pelvic orthopedic and skeletal findings:  Pelvic obliquity consistent with pain presentation  Abnormal muscle tension and trigger points contributing to pain and dysfunction  Poor trunk and pelvic stability contributing to pain and dysfunction    Ethan Bernard presents with the following findings for pelvic floor motor function:  Not tested this visit due to orthopedic structural dysfunction         A current functional outcome assessment  was performed. The exam for functional outcome deficiencies was positive (abnormal exam).  Plan of care  was done.Marland Kitchen     Physical therapy Long Term goals to be achieved in 12  Weeks:    1. Pt will be independent with HEP for pain control to have long term control of condition.    2. Pt to report decrease of pain by 80-100% by resolution of musculoskeletal and pelvic floor dysfunctions.   3. Pt will return to functional ADL's, all exercise and work related activities, not limited by pain.  4. Pt to report ability to resume sexual activity not limited by pain.   5. Pt will demonstrate ability to assess and self correct pelvic malalgingment and be independent in therapeutic stretching and core stabilization exercises to maintain neutral pelvic alignment.            Physical Therapy Plan of Treatment    Ongoing pt education and HEP advancement  Therapeutic exercise  Neuromotor re-education  Manual therapy  Trigger Point Dry Needling  Progressive PME's  Modalities as needed for pain control ([place], TENS, IFES, NMES, heat, ice)  Home rental neuromuscular electric stimulation for pelvic floor  EMG assessment  Biofeedback training  Bladder scan     Will continue to monitor pelvic alignment, provide manual therapy and therapeutic exercises for correction. Internal and EMG pelvic floor assessment and treatment to reduce painful spasm, tightened tissue and eradicate TP's. Provide progressive pelvic stabilization exercises.        Frequency:  once a week for 12-16 weeks,         Next visit focus on:  external MT  reassess trunk/hip/LE ROM/flexibility/strength     Chronic Prostatitis Symptom Index for the Past Week 02/03/2015   Pain or discomfort in the area between rectum and testicles (perineum)? 0   Pain or discomfort in the testicles? 0   Pain or discomfort in the tip of the penis (not related to urination)? 0   Pain or discomfort below your waist, in your pubic or bladder area? 1    Pain or burning during urination? 0   Pain or discomfort during or after sexual climax? 0   How often have you had pain or discomfort in any of the above areas? 1   AVERAGE pain or discomfort level on the days you had it? 2   Pain or Discomfort Score 4   Have had a sensation of not emptying your bladder completely? 0   Have had to urinate again less that 2 hrs after urinating? 1   Urinary Symptom Score 1   Symptom Scale Score 5   Have your symptoms kept you from doing things you usually do? 0   How much did you think about your symptoms? 1   How would you feel about these symptoms for the rest of your life? 3   Quality of Life Score 4     ]

## 2015-02-09 ENCOUNTER — Institutional Professional Consult (permissible substitution)
Admit: 2015-02-09 | Discharge: 2015-02-09 | Payer: PRIVATE HEALTH INSURANCE | Attending: Rehabilitative and Restorative Service Providers" | Primary: Family Medicine

## 2015-02-09 DIAGNOSIS — M62838 Other muscle spasm: Secondary | ICD-10-CM

## 2015-02-09 NOTE — Progress Notes (Signed)
Physical Therapy Treatment Note     Patient: Ethan Bernard    DOS: 02/09/2015 Visit #: 2    Subjective    Pt reports:  pain decreasing: less pain with functional activities   Doing the MET- "seems to help"    Objective    External Manual Therapy    Upon palpatory assessment in supine, prone or stance, it was noted that patient continues to have pelvic malalignment consistent with pain symptoms. Following MT, pt and therapist performed MET to restore more normal pelvic alignment. Stretching pelvic/ hip musculature and heat applied post MET as appropriate.        Therapeutic exercise    Patient performed and was instructed in the home program for the following flexibility exercises: level 1;  single knee to chest, double knee to chest, happy baby yoga pose and butterfly yoga pose  level 2;  piriformis stretches , modified child's pose and hip flexor / iliopsoas; kneeling- added focused TFL stretching  Cat/ cow stretching            Modalities    Moist heat used post treatment to abdominal  and pelvic region in supine position for 10 minutes      Patient education    Pt given home program education [id number] therapeutic stretching and Use of heat and/or cold for pain management       Assessment    Left hip flexor pain/ tightness     Plan    Will continue to monitor pelvic alignment, provide manual therapy and therapeutic exercises for correction. Internal and EMG pelvic floor assessment and treatment to reduce painful spasm, tightened tissue and eradicate TP's. Provide progressive pelvic stabilization exercises.      Follow-up Disposition: Not on File

## 2015-03-02 ENCOUNTER — Encounter: Attending: Rehabilitative and Restorative Service Providers" | Primary: Family Medicine

## 2015-03-16 ENCOUNTER — Encounter: Primary: Family Medicine

## 2015-05-14 NOTE — Addendum Note (Signed)
Addended by: Wonda HornerBINYAM, Lousie Calico on: 05/14/2015 02:34 PM      Modules accepted: Orders

## 2015-05-18 ENCOUNTER — Emergency Department (HOSPITAL_COMMUNITY)
Admission: EM | Admit: 2015-05-18 | Discharge: 2015-05-18 | Disposition: A | Discharge status: Home / Self Care | Payer: Self-pay | Attending: Emergency Medicine | Admitting: Emergency Medicine

## 2015-05-18 ENCOUNTER — Encounter (HOSPITAL_COMMUNITY): Payer: Self-pay | Admitting: Family Medicine

## 2015-05-18 ENCOUNTER — Emergency Department (HOSPITAL_COMMUNITY): Payer: Self-pay

## 2015-05-18 DIAGNOSIS — R079 Chest pain, unspecified: Secondary | ICD-10-CM | POA: Insufficient documentation

## 2015-05-18 DIAGNOSIS — F17200 Nicotine dependence, unspecified, uncomplicated: Secondary | ICD-10-CM | POA: Insufficient documentation

## 2015-05-18 LAB — BASIC METABOLIC PANEL
Anion gap: 7 (ref 5–15)
BUN: 11 mg/dL (ref 6–20)
CO2: 30 mmol/L (ref 22–32)
Calcium: 9.7 mg/dL (ref 8.9–10.3)
Chloride: 104 mmol/L (ref 101–111)
Creatinine, Ser: 1.01 mg/dL (ref 0.61–1.24)
GFR calc Af Amer: >60 mL/min (ref >60)
GFR calc non Af Amer: >60 mL/min (ref >60)
Glucose, Bld: 108 mg/dL — ABNORMAL HIGH (ref 65–99)
Potassium: 5 mmol/L (ref 3.5–5.1)
Sodium: 141 mmol/L (ref 135–145)

## 2015-05-18 LAB — I-STAT TROPONIN, ED: TROPONIN I, POC: 0 ng/mL (ref 0.00–0.08)

## 2015-05-18 LAB — CBC
HCT: 48.9 % (ref 39.0–52.0)
Hemoglobin: 16.8 g/dL (ref 13.0–17.0)
MCH: 31.6 pg (ref 26.0–34.0)
MCHC: 34.4 g/dL (ref 30.0–36.0)
MCV: 92.1 fL (ref 78.0–100.0)
Platelets: 251 10*3/uL (ref 150–400)
RBC: 5.31 MIL/uL (ref 4.22–5.81)
RDW: 12.5 % (ref 11.5–15.5)
WBC: 10.3 10*3/uL (ref 4.0–10.5)

## 2015-05-18 NOTE — ED Notes (Signed)
Pt here with chest that started this am around 1. sts constant. st[facility] gave him [facility] cocktail with some relief. sts also sent her for opoid use and benzos. sts he is currently on soboxin.

## 2015-05-18 NOTE — ED Notes (Signed)
Pt states "i'm leaving" Pt ambulated out of ER without issue.

## 2016-12-20 ENCOUNTER — Telehealth: Payer: Self-pay

## 2016-12-20 NOTE — Telephone Encounter (Signed)
Pt scheduled from wait list. Pt scheduled [facility] at [address], on Monday, 12/25/16 with a 9:30 am financial interview and a 10:00 am diagnostic assessment with Franco Colletebecca Holmes.     Should pt need to cancel or reschedule please call [phone].

## 2016-12-20 NOTE — Telephone Encounter (Signed)
[facility] - Ambulatory Telephone Intake Screen     Patient Information:    Patient's Name: Ronald KenningJustin Richard  Patient's Date of Birth: 08/24/1985  Patient's Medical Record Number: [id number]  Patient's Home Phone: [phone] (home)  Patient's Work Phone: There is no work phone number on file.  Patient's Mobile Phone:   No relevant phone numbers on file.     Patient's Marital Status (if applicable): Data Unavailable  Can a message be left? yes      Insurance Information:    [email or web address]  Policy number: no      Referral Source:  Referral Source: self      Presenting Concern:     What is the reason you are seeking care?     (Please provide a brief description of the reasons the patient or referring provider is seeking mental health treatment at this time.  Please use patients own words where possible).    bipolor depression                                    Mental Health History:    Are you currently involved in mental health treatment?  No       Are you currently taking a long acting injectable medication?  No    Will you need an injection at the time of your first intake assessment appointment?   no      Customer Service:    Do you need arrangements for?    Wheelchair: No    Interpreter Services:No

## 2016-12-20 NOTE — Telephone Encounter (Signed)
Pt contacted from wait list. A sooner intake has become available. VM has been left for PT requesting a return call.

## 2016-12-25 ENCOUNTER — Ambulatory Visit: Payer: PRIVATE HEALTH INSURANCE | Attending: Psychiatry | Admitting: Social Worker

## 2016-12-25 DIAGNOSIS — F1220 Cannabis dependence, uncomplicated: Secondary | ICD-10-CM | POA: Insufficient documentation

## 2016-12-25 DIAGNOSIS — F1120 Opioid dependence, uncomplicated: Secondary | ICD-10-CM | POA: Insufficient documentation

## 2016-12-25 DIAGNOSIS — F419 Anxiety disorder, unspecified: Secondary | ICD-10-CM

## 2016-12-25 NOTE — BH Intake Assessment (Signed)
[facility] Initial Diagnostic Assessment     Length of session: 60 minutes    Service Location:  General Adult Ambulatory Clinic @ [facility]    Referral Source:  Referral Source: self  Collateral Contacts: none    Chief Complaint:   "I wish I wouldn't keep going back and forth with the drugs. My sleep is bad."    HPI:  Recent events/precipitants include: Client reports he has abused substance of opiate pills, marijuana, and alcohol beginning at age 2 and feels that he is using substances to self-medicate sx of depression and anxiety. Reports sx of anxiety and depression impact his relationship with his girlfriend and performance at work as a Comptroller. Client further elaborates that he feels he has "OCD" because he has obsessive thoughts mostly in relationships with girlfriends - will feel he needs to be in control or things need to go his way, will become irritable and agitated until he feels things are going his way. Reports poor sleep "I wake up often, I'm a light sleeper." Feels he has had poor sleep for most of his life, reports his mother told him he had difficulty sleeping as a child.     Reports onset of opiate use at age 19 when he had knee replacement surgery and reports longest period without using opiate pills since that time was around his early 20's for 1.5 years.  Reports current use of opiate pills that he does not obtain through a medical provider of 3 times a day "morning, noon and night, some days more, some days less." Reports daily marijuana use. Reports hx of binge drinking beginning in his teens but denies alcohol use currently "that has decreased almost completely, I rarely drink now." Reports he has used heroin via snorting 3-5 times in the past. Reports hx of cocaine use but only when drinking alcohol. Denies recent use of cocaine or heroin.     Pt's stressors include: Reports he has missed work due to anxiety and is experiencing increased anxiety secondary to  guilt/shame related to hiding his opiate abuse from his girlfriend. Reports he worries about finances. Reports he recently moved to [place], [place] from a small town in [place] to live with his girlfriend.       Patient Behavioral Health History:  Currently Receiving Mental Health Treatment:  None Reported.    Currently Taking Psychotropic Medications (current/in the past year): None Reported.    Does individual report problems (current) with any of the following?   Illegal Drugs, Prescription Drugs    Currently Engaged in Treatment for Substance Use/Abuse: None Reported.  CurrentlyTaking Medications to Treat Addiction (in the past year): None Reported.      Reports history of treatment beginning at age 70 - for mood dysregulation. Reports he received individual therapy and medication management from a psychiatrist and therapist. Reports he was prescribed Lamictal for mood stabilization and Seroquel for sleep however they stopped prescribing due to his alcohol use. Reports he was referred to AA, unclear if he was referred to formal cd treatment in the past. Reports last ep of care was during mid-twenties.     FAMILY/SOCIAL HISTORY:  Client reports he was born to the union of his mother and father who divorced when the client was age 39. Reports he is the oldest of 3 children and reports 2 younger brothers who are age 80 and 92 who live in [place] and [place]. Client reports two additional step children from his father  who remarried. He reports current significant relationship with girlfriend of 1.5 years. Client describes his childhood as "fine, normal, I had a good childhood." Client reports his parents divorce was "a little" difficult but he was able to cope with this. He endorses that his parents are positive social supports in his life. He reports that his girlfriend is also supportive of his treatment, however, she is unaware of his current opiate abuse.     Family Composition:  Patient reports he  lives with his girlfriend.     Education:  Reports he completed highschool in [place]. Reports he attempted college right after graduating HS but was unsuccessful. 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 He denies any history of special education or learning disability. He denies hx of behavioral problems in school.     Employment:  Social History     Social History    Marital status: Unknown     Spouse name: N/A    Number of children: N/A    Years of education: N/A     Occupational History    Not on file.     Social History Main Topics    Smoking status: Not on file    Smokeless tobacco: Not on file    Alcohol use Not on file    Drug use: Not on file    Sexual activity: Not on file     Social History Narrative           Trauma:  Requires further assessment. Client did not report hx of trauma.     No past medical history on file.  No past surgical history on file.      Domestic Violence:  Patient and/or identification tool has not identified the presence of domestic violence at this time.    Mental Status Exam:  APPEARANCE: Appears stated age  ATTITUDE TOWARD INTERVIEWER: Cooperative  MOTOR ACTIVITY: WNL (within normal limits)  EYE CONTACT: Direct  SPEECH: Normal rate and tone  AFFECT: Neutral  MOOD: Normal  THOUGHT PROCESS: Goal directed  THOUGHT CONTENT: No unusual themes  PERCEPTION: No evidence of hallucinations  CURRENT SUICIDAL IDEATION: patient denies  CURRENT HOMICIDAL IDEATION: Patient denies  ORIENTATION: Alert and Oriented X 3.  CONCENTRATION: Fair  MEMORY:   Recent: intact   Remote: intact  COGNITIVE FUNCTION: Above Average intelligence  JUDGMENT: Intact  IMPULSE CONTROL: Fair and Poor  INSIGHT: Fair    Assessment of Risk For Suicidal Behavior:  The items prior to Risk Formulation and Summary in this assessment can guide the collection of relevant risk-related information. These data inform the Risk Formulation and  Summary, which is the primary focus of this assessment. Be sure to document the rationale (reasoning) behind your clinical judgment of risk.     [place] Scale administered? Yes,  Patient responded No on questions 4/5/6a.    Predisposing Vulnerabilities:  Chronic substance abuse, Chronic conflict or abuse in key relationship(s)    Non-Suicidal Self-Injury:  None reported by patient.     Recent Stressful Life Event(s):  Additional details or comments: Missing work     Clinical Presentation:  Deteriorated coping/problem solving (incl. highly narrow view of options), Impulsivity, recklessness (incl. subjective feeling "out of control"), Alcohol or drug intoxication, Irritability/anger/agitation     Access to Lethal Means (weapons/firearms, medications, other):  Yes - access to illicit drugs however denies intent to overdose or cause intentional harm to self or others.  Opportunities for Crisis and Treatment Planning/Protective Factors:  Able to identify reasons for living, Does not view suicide as a personal option, Good physical health, Hopefulness, Perceived reasons to live are greater than reasons to die, Supportive relationships, Lives with a partner or other family, Future oriented     Engagement and Reliability:  Engagement with attempts to interview/help: good   Assessment of reliability of report: fair  Additional details or comments: Not applicable.     Suicide Risk Formulation and Summary:   Synthesize information gathered into an overall judgment of risk.     Overall Clinical Judgment of Risk: (Indicate your judgment of this individual's long and short term risk)   - Long-term./Chronic Risk: Low/Moderate   - Short-term/Acute Risk: Low/Moderate     Synthesis and Rationale for Clinical Judgment of Risk: Describe: Pt denies hx of SI or HI. Pt denies current SI and HI. Client presents with protective factors of future orientation, hope that his situation will improve, supportive  relationships, and is goal oriented. Client is assessed as low-moderate acute and chronic risk at this time.       - Plan: Monitoring beyond usual for suicide risk not indicated at this time.     Assessment of Risk For Violent Behavior:    Current violence ideation: No  Current violence intent: No  Current violence plan: No  Recent (within past 8 weeks) violent or threatening thoughts or behaviors: No  Prior history of any violent or threatening behavior toward others: Yes - Client reports hx of verbal abuse towards others. Reports hx of fighting - denies physical violence in his personal relationships.   Prior legal involvement (family, civil, or criminal) related to threatening or violent behavior: Yes - reports 1 restraining order filed against him in a previous relationship during his early 20's for a period of 1.5 years.   Current involvement in a protection order proceeding: No  History of destruction to property: Yes - reports history of punching walls, throwing objects; If yes, most recent date: Reports this has not occurred for several months.     Violence Risk Formulation and Summary:  Synthesize information gathered into an overall judgment of risk.    Overall Clinical Judgment of Risk (indicate your judgment of this individual's long and short-term risk):    - Long-term./Chronic Risk: Elevated/Moderate   - Short-term/Acute Risk: Low/Moderate    Synthesis and Rationale for Clinical Judgment of Risk: Describe: Client denies intent to harm self or others. Denies homicidal thoughts or ideations. Does reports history of verbal aggression towards significant others in the past and controlling behavior. Reports history of fighting but denies physical violence in his personal relationships. Denies intent and reports this has occurred in the context of alcohol use in the past. Reports frequency of outbursts: "3-5 times a year." Client able to deny intent or plan of aggressive or violent behavior.       - Plan: Monitoring beyond usual for violence risk not indicated at this time.      Working Diagnosis:      ICD-10-CM ICD-9-CM   1. Opioid use disorder, moderate, dependence F11.20 304.00   2. Cannabis use disorder, moderate, dependence F12.20 304.30   3. Anxiety disorder, unspecified type F41.9 300.00       Impression/Formulation:  Client is a 31 year old caucasian male who is self-referred to treatment reporting sx of poor sleep, increased anxiety, depressed mood, and obsessive thoughts and ruminations in the context of chronic substance use and potential axis  2 personality traits. Client reports history of difficulty managing anger, controlling and verbally abusive behavior in his romantic relationships which he feels shameful about. Client self-reports he would like to "stop going back and forth with the drugs" and reduce anxiety and depression related to these stressors. Client appears to have difficulty functioning due to poor impulse control and rigidity in personality traits which could contribute to controlling and abusive behaviors in his relationships. Client would benefit from referral to chemical dependency treatment to address substance use disorder and further assessment of co-occurring disorder of depression, anxiety, and personality disorder. Client may benefit from interpersonal therapy, CBT and DBT skills training in addition to chemical dependency treatment.     Plan:  Patient will not be admitted for further mental health treatment in the [facility] Adult Ambulatory Clinic .  Treatment recommendations include:  evaluation for substance abuse treatment.     NEXT APPT: Client will be non-admitted and referred to CD treatment, thus another appointment will not be scheduled at this time.      Franco Collet, LMSW

## 2016-12-25 NOTE — Progress Notes (Deleted)
[facility] Initial Diagnostic Assessment     Length of session: 60 minutes    Service Location:  General Adult Ambulatory Clinic @ [facility]    Referral Source:  Referral Source: self  Collateral Contacts: none    Chief Complaint:   "I wish I wouldn't keep going back and forth with the drugs. My sleep is bad."    HPI:  Recent events/precipitants include: Client reports he has abused substance of opiate pills, marijuana, and alcohol beginning at age 19 and feels that he is using substances to self-medicate sx of depression and anxiety. 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 Client further elaborates that he feels he has "OCD" because he has obsessive thoughts mostly in relationships with girlfriends - will feel he needs to be in control or things need to go his way, will become irritable and agitated until he feels things are going his way. Reports poor sleep "I wake up often, I'm a light sleeper." Feels he has had poor sleep for most of his life, reports his mother told him he had difficulty sleeping as a child.     Reports onset of opiate use at age 19 when he had knee replacement surgery and reports longest period without using opiate pills since that time was around his early 20's for 1.5 years.  Reports current use of opiate pills that he does not obtain through a medical provider of 3 times a day "morning, noon and night, some days more, some days less." Reports daily marijuana use. Reports hx of binge drinking beginning in his teens but denies alcohol use currently "that has decreased almost completely, I rarely drink now." Reports he has used heroin via snorting 3-5 times in the past. Reports hx of cocaine use but only when drinking alcohol. Denies recent use of cocaine or heroin.     Pt's stressors include: Reports he has missed work due to anxiety and is experiencing increased anxiety secondary to  guilt/shame related to hiding his opiate abuse from his girlfriend. Reports he worries about finances. Reports he recently moved to [place], [place] from a small town in [place] to live with his girlfriend.       Patient Behavioral Health History:  Currently Receiving Mental Health Treatment:  None Reported.    Currently Taking Psychotropic Medications (current/in the past year): None Reported.    Does individual report problems (current) with any of the following?   Illegal Drugs, Prescription Drugs    Currently Engaged in Treatment for Substance Use/Abuse: None Reported.  CurrentlyTaking Medications to Treat Addiction (in the past year): None Reported.      Reports history of treatment beginning at age 23 - for mood dysregulation. Reports he received individual therapy and medication management from a psychiatrist and therapist. Reports he was prescribed Lamictal for mood stabilization and Seroquel for sleep however they stopped prescribing due to his alcohol use. Reports he was referred to AA, unclear if he was referred to formal cd treatment in the past. Reports last ep of care was during mid-twenties.     FAMILY/SOCIAL HISTORY:  Client reports he was born to the union of his mother and father who divorced when the client was age 17. Reports he is the oldest of 3 children and reports 2 younger brothers who are age 21 and 26 who live in [place] and [place]. Client reports two additional step children from his father   who remarried. He reports current significant relationship with girlfriend of 1.5 years. Client describes his childhood as "fine, normal, I had a good childhood." Client reports his parents divorce was "a little" difficult but he was able to cope with this. He endorses that his parents are positive social supports in his life. He reports that his girlfriend is also supportive of his treatment, however, she is unaware of his current opiate abuse.     Family Composition:  Patient reports he  lives with his girlfriend.     Education:  Reports he completed highschool in [place]. Reports he attempted college right after graduating HS but was unsuccessful. Reports he returned to school and completed a 2 year degree in mechanical engineering in September of 2017. He denies any history of special education or learning disability. He denies hx of behavioral problems in school.     Employment:  Social History     Social History   • Marital status: Unknown     Spouse name: N/A   • Number of children: N/A   • Years of education: N/A     Occupational History   • Not on file.     Social History Main Topics   • Smoking status: Not on file   • Smokeless tobacco: Not on file   • Alcohol use Not on file   • Drug use: Not on file   • Sexual activity: Not on file     Social History Narrative           Trauma:  Requires further assessment. Client did not report hx of trauma.     No past medical history on file.  No past surgical history on file.      Domestic Violence:  Patient and/or identification tool has not identified the presence of domestic violence at this time.    Mental Status Exam:  APPEARANCE: Appears stated age  ATTITUDE TOWARD INTERVIEWER: Cooperative  MOTOR ACTIVITY: WNL (within normal limits)  EYE CONTACT: Direct  SPEECH: Normal rate and tone  AFFECT: Neutral  MOOD: Normal  THOUGHT PROCESS: Goal directed  THOUGHT CONTENT: No unusual themes  PERCEPTION: No evidence of hallucinations  CURRENT SUICIDAL IDEATION: patient denies  CURRENT HOMICIDAL IDEATION: Patient denies  ORIENTATION: Alert and Oriented X 3.  CONCENTRATION: Fair  MEMORY:   Recent: intact   Remote: intact  COGNITIVE FUNCTION: Above Average intelligence  JUDGMENT: Intact  IMPULSE CONTROL: Fair and Poor  INSIGHT: Fair    Assessment of Risk For Suicidal Behavior:  The items prior to Risk Formulation and Summary in this assessment can guide the collection of relevant risk-related information.  These data inform the Risk Formulation and  Summary, which is the primary focus of this assessment.  Be sure to document the rationale (reasoning) behind your clinical judgment of risk.     [place] Scale administered? Yes,  Patient responded No on questions 4/5/6a.    Predisposing Vulnerabilities:  Chronic substance abuse, Chronic conflict or abuse in key relationship(s)    Non-Suicidal Self-Injury:  None reported by patient.     Recent Stressful Life Event(s):  Additional details or comments: Missing work     Clinical Presentation:  Deteriorated coping/problem solving (incl. highly narrow view of options), Impulsivity, recklessness (incl. subjective feeling "out of control"), Alcohol or drug intoxication, Irritability/anger/agitation     Access to Lethal Means (weapons/firearms, medications, other):  Yes - access to illicit drugs however denies intent to overdose or cause intentional harm to self or others.        Opportunities for Crisis and Treatment Planning/Protective Factors:  Able to identify reasons for living, Does not view suicide as a personal option, Good physical health, Hopefulness, Perceived reasons to live are greater than reasons to die, Supportive relationships, Lives with a partner or other family, Future oriented     Engagement and Reliability:  Engagement with attempts to interview/help: good   Assessment of reliability of report: fair  Additional details or comments: Not applicable.     Suicide Risk Formulation and Summary:    Synthesize information gathered into an overall judgment of risk.     Overall Clinical Judgment of Risk: (Indicate your judgment of this individual's long and short term risk)              - Long-term./Chronic Risk: Low/Moderate              - Short-term/Acute Risk: Low/Moderate     Synthesis and Rationale for Clinical Judgment of Risk: Describe: Pt denies hx of SI or HI. Pt denies current SI and HI. Client presents with protective factors of future orientation, hope that his situation will improve, supportive  relationships, and is goal oriented. Client is assessed as low-moderate acute and chronic risk at this time.                  - Plan: Monitoring beyond usual for suicide risk not indicated at this time.     Assessment of Risk For Violent Behavior:    Current violence ideation: No  Current violence intent: No  Current violence plan: No  Recent (within past 8 weeks) violent or threatening thoughts or behaviors: No  Prior history of any violent or threatening behavior toward others: Yes - Client reports hx of verbal abuse towards others. Reports hx of fighting - denies physical violence in his personal relationships.   Prior legal involvement (family, civil, or criminal) related to threatening or violent behavior: Yes - reports 1 restraining order filed against him in a previous relationship during his early 20's for a period of 1.5 years.   Current involvement in a protection order proceeding: No  History of destruction to property: Yes - reports history of punching walls, throwing objects; If yes, most recent date: Reports this has not occurred for several months.     Violence Risk Formulation and Summary:  Synthesize information gathered into an overall judgment of risk.    Overall Clinical Judgment of Risk (indicate your judgment of this individual's long and short-term risk):    - Long-term./Chronic Risk: Elevated/Moderate   - Short-term/Acute Risk: Low/Moderate    Synthesis and Rationale for Clinical Judgment of Risk: Describe: Client denies intent to harm self or others. Denies homicidal thoughts or ideations. Does reports history of verbal aggression towards significant others in the past and controlling behavior. Reports history of fighting but denies physical violence in his personal relationships. Denies intent and reports this has occurred in the context of alcohol use in the past. Reports frequency of outbursts: "3-5 times a year." Client able to deny intent or plan of aggressive or violent behavior.       - Plan: Monitoring beyond usual for violence risk not indicated at this time.      Working Diagnosis:      ICD-10-CM ICD-9-CM   1. Opioid use disorder, moderate, dependence F11.20 304.00   2. Cannabis use disorder, moderate, dependence F12.20 304.30   3. Anxiety disorder, unspecified type F41.9 300.00       Impression/Formulation:  Client is a 31 year old caucasian male who is self-referred to treatment reporting sx of poor sleep, increased anxiety, depressed mood, and obsessive thoughts and ruminations in the context of chronic substance use and potential axis   2 personality traits. Client reports history of difficulty managing anger, controlling and verbally abusive behavior in his romantic relationships which he feels shameful about. Client self-reports he would like to "stop going back and forth with the drugs" and reduce anxiety and depression related to these stressors. Client appears to have difficulty functioning due to poor impulse control and rigidity in personality traits which could contribute to controlling and abusive behaviors in his relationships. Client would benefit from referral to chemical dependency treatment to address substance use disorder and further assessment of co-occurring disorder of depression, anxiety, and personality disorder. Client may benefit from interpersonal therapy, CBT and DBT skills training in addition to chemical dependency treatment.     Plan:  Patient will not be admitted for further mental health treatment in the [facility] .  Treatment recommendations include:  evaluation for substance abuse treatment.     NEXT APPT: Client will be non-admitted and referred to CD treatment, thus another appointment will not be scheduled at this time.      Elonna Mcfarlane, LMSW

## 2016-12-27 ENCOUNTER — Encounter: Payer: Self-pay | Admitting: Social Worker

## 2016-12-27 DIAGNOSIS — F1120 Opioid dependence, uncomplicated: Secondary | ICD-10-CM

## 2016-12-27 DIAGNOSIS — F419 Anxiety disorder, unspecified: Secondary | ICD-10-CM

## 2016-12-27 DIAGNOSIS — F1220 Cannabis dependence, uncomplicated: Secondary | ICD-10-CM

## 2016-12-29 ENCOUNTER — Telehealth: Payer: Self-pay | Admitting: Social Worker

## 2016-12-29 NOTE — Telephone Encounter (Signed)
Writer attempted to contact pt via telephone to inform that a referral to [facility] was completed and that staff will be contacting him to assist with scheduling an evaluation. Phone number provided in EMR was not valid, therefore, writer was unable to relay this message. Writer unable to utilize alternative number on file for pt's signifincant other as client did not provide release and had stated that he had not informed his partner that he is interested in CD treatment.

## 2017-01-03 ENCOUNTER — Telehealth: Payer: Self-pay

## 2017-01-03 NOTE — Telephone Encounter (Signed)
Patient is contacting [facility] in regards to intake. Patient had received [place] mail from [place] and made the attempt to reach her during his work break. Patient can be contacted at [phone], thank you!

## 2017-01-04 ENCOUNTER — Telehealth: Payer: Self-pay

## 2017-01-04 ENCOUNTER — Ambulatory Visit: Payer: PRIVATE HEALTH INSURANCE

## 2017-01-04 NOTE — Telephone Encounter (Signed)
Referral [facility] from [facility]. Writer reached out to pt and left voicemail with instructions for a return call. Pt returned writers call and left a Technical sales engineervoicemail for writer.    Writer again returned pt's call and left another message.

## 2017-01-08 ENCOUNTER — Telehealth: Payer: Self-pay

## 2017-01-08 NOTE — Telephone Encounter (Signed)
Pt returned writer's call and discussed interested in having a CD evaluation at [facility]. Writer informed him that there is a current wait list and pt agrees to being placed on the list and understands he will receive a call once his name reaches the top. Writer also provided pt with resources for other CD programs in the area.

## 2017-01-31 ENCOUNTER — Telehealth: Payer: Self-pay

## 2017-01-31 NOTE — Telephone Encounter (Signed)
Pt's name has reached the top of the [facility] wait list. Writer outreached to pt in attempts of scheduling him for a CD evaluation at [facility] but there was no answer. Writer left a voicemail with instructions for a return call within 48 hours if still interested, otherwise his name will be removed from the list.

## 2017-02-07 ENCOUNTER — Telehealth: Payer: Self-pay

## 2017-02-07 NOTE — Telephone Encounter (Signed)
[facility] Telephone Intake Screen     Patient Information:  Patient's Name: Ronald Richard  Patient's Date of Birth: 03/02/1985  Patient's Medical Record Number: [id number]  Patient's Home Phone: [phone]  Patient's Work Phone:   Patient's Mobile Phone:   Telephone Information:   Mobile [phone]     Patient's Marital Status (if applicable): Unknown      Insurance Information:  [employer]      Referral Source:  Referral Source: Self-Referral      Presenting Concern:   What is the reason you are seeking care?     (Please provide a brief description of the reasons the patient or referring provider is seeking mental health treatment at this time.  Please use patients own words where possible).    Pt [facility] but reports he is no longer attending services there, but is still interested in treatment at [facility]. He reports the reason he is interested is "I've been diagnosed with bipolar depression in the past but I also have substance and alcohol abuse programs". He reports "alcohol is not really a big thing anymore" but he is smoking THC about 5x per week, sometimes 1x per day. He also uses opiate pills at least 1x per day, of either percocet or oxycodone and uses "at least 15 to 20 mg per day". He reports he [place] that was "geared towards chemical dependency and mental health" and he is hoping to receive dual [facility] as well. Writer and pt discussed the option of the [facility] detox [facility] and pt reports he has never been prescribed Suboxone before but has tried it on his own to "wean himself off". He is able to stop using for about 3 to 5 days without anything but feels withdrawal symptoms such as night sweats, irritability, and lethargy and returns to use. He would be interested in being prescribed Suboxone in the outpatient setting but is not able to commit to the detox program due to his full  time work schedule. Writer informed pt that if he misses this appointment for any reason, he would have to be re added to the wait list.                                    Mental Health History:  Are you currently involved in mental health treatment?  No       Are you currently taking a long acting injectable medication?  No    Will you need an injection at the time of your first intake assessment appointment?   no    Treatment/Substance Use History:  Current Other Substance use: yes, if yes, what substance alcohol, THC, opiates    Quantity and Frequency: daily    Last date of use: daily     Currently in Chemical Dependency treatment? No      Legal System Involvement:  Any current involvement with legal system? No   Legal contact name: N/A      Upcoming court/appearance date? No      Risk Assessment:  Self Injury: Patient Denies  Suicidal Ideation: Patient Denies  Homicidal Ideation: Patient Denies  Aggressive Behavior: Patient Denies    If yes, plan: Other: N/A    Customer Service:  Interpreter Services:No     Intake Scheduling:  (Fee interview 30 minutes prior  to scheduled appointment with therapist)    Date: Tuesday 02/27/17  Time: Fee interview 7:30, intake 8:00 with Ronald Richard

## 2017-02-07 NOTE — Telephone Encounter (Signed)
Writer received a call back from pt at 6pm on Sunday 12/16 stating he is aware it was past 48 hours but would still like to scheduled a CD evaluation. Writer returned call to pt and there was no answer. Writer left a voicemail requesting a call back from pt by Friday 12/21 in order to get scheduled for a CD evaluation. Writer stated clearly in the voicemail that if pt returns writers call after Friday 12/21, he will need to be re added to the wait list.

## 2017-02-15 ENCOUNTER — Encounter: Payer: Self-pay | Admitting: Social Worker

## 2017-02-15 DIAGNOSIS — F1120 Opioid dependence, uncomplicated: Secondary | ICD-10-CM

## 2017-02-15 DIAGNOSIS — F419 Anxiety disorder, unspecified: Secondary | ICD-10-CM

## 2017-02-15 DIAGNOSIS — F1220 Cannabis dependence, uncomplicated: Secondary | ICD-10-CM

## 2017-02-15 NOTE — Progress Notes (Signed)
Clinical Management Note     Client will be non-admitted at this time and referred to [facility] for assessment of treatment needs to address opioid and marijuana use disorder. Client has been encouraged to obtain co-occurring treatment once he has established a CD provider.

## 2017-02-26 ENCOUNTER — Telehealth: Payer: Self-pay

## 2017-02-26 NOTE — Telephone Encounter (Signed)
Writer contacted pt to complete phone fee interview prior to appt on 1/8 at 7:30/8am w/ Shelba FlakeNicole G. Pt was unavailable at time of call & a vm was left for pt to contact writer back at [phone] or Prich at [phone].    Per online verification of benefits pt has a $6550 ded/oop to satisfy before insurance covers at 100%. Nothing has been applied to ded/oop.

## 2017-02-27 ENCOUNTER — Other Ambulatory Visit
Admission: RE | Admit: 2017-02-27 | Discharge: 2017-02-27 | Disposition: A | Discharge status: Home / Self Care | Payer: PRIVATE HEALTH INSURANCE | Source: Ambulatory Visit | Attending: Psychiatry | Admitting: Psychiatry

## 2017-02-27 ENCOUNTER — Ambulatory Visit: Payer: PRIVATE HEALTH INSURANCE

## 2017-02-27 ENCOUNTER — Ambulatory Visit: Payer: PRIVATE HEALTH INSURANCE | Attending: Psychiatry

## 2017-02-27 DIAGNOSIS — F1120 Opioid dependence, uncomplicated: Secondary | ICD-10-CM

## 2017-02-27 DIAGNOSIS — Z0189 Encounter for other specified special examinations: Secondary | ICD-10-CM | POA: Insufficient documentation

## 2017-02-27 LAB — CHEM DEPEND + ETOH SCRN, URINE
Amphetamine,UR: NEGATIVE
Benzodiazepinen,UR: NEGATIVE
Cocaine/Metab,UR: NEGATIVE
Ethanol,UR: NEGATIVE
Opiates,UR: NEGATIVE
Oxycodone/Oxymorphone,UR: POSITIVE
THC Metabolite,UR: POSITIVE

## 2017-02-27 NOTE — BH Intake Assessment (Addendum)
[facility] Initial Assessment     Date of Service: 02/27/17  Duration:  60 minutes    Identifying Data:  Age: 32 y.o.  Race: Caucasian  Marital status: Single - Currently in a relationship  Insurance: [employer]  CIN #:       [facility] Location:  [facility]     Referral Source:  Referral Source: Self-Referral  Collateral Contacts: Best friend - Theora Gianotti [phone], Mother - Everardo Beals [phone] - unable to obtain consent for collateral contacts due to lack of computer access during intake.      Current Living Situation:  Residence: Private Residence -  Living with girlfriend  Daily schedule: "I go to work 7-4:30, Usually I workout 3-4 days per week"  Support network consists of "Jake (bestfriend), girlfriend, mother"  Sources of financial support: work full-time - [employer] Complaint: "I suffer from substance abuse and people say that I have alcohol abuse. If you consider binge drinking abuse then I guess. I suffer mainly from pain pills."    Military Service:  Have you served in the [employer]?: No  Are you the child of a veteran/active duty? No     Domestic Violence:  Patient and/or identification tool has not identified the presence of domestic violence at this time.    HPI:  (Including onset of symptoms, severity of symptoms, and circumstances leading to seeking treatment)    Opiates   Age of first use: 18  Progression of use: Pt reported beginning opiate use at age 36, starting to use oxycotin. Pt reported, " I was hanging out with people who did a lot of drugs". Pt reported that he was using only on weekends in the beginning, using 20,g-50m pills. Pt reported, "I didn't even like using at first because I would get sick. But then I just got used to it". Pt reported that he began using via inhalation. Pt reported, "I used it as a party drug". Pt stated that at age [age]he had knee surgery and was prescribed percocet 537m He reported that he took it orally for about 2 months  and "I was calling my doctor to refill my prescription all the time". Pt reported, "this brought on my addiction I think". Pt reported that once he was no longer prescribed percocet he began using Vicodin and morphine as well as percocet from the street. Pt reported that he used opiates in addition to benzos "off and on" until age [age]Pt reported that he attempted to quit use and from age [age] he had 1.5 years of sobriety from opiates. Pt reported that his girlfriend at the time broke up with him and he felt as though he was going to relapse due to feeling depressed so he referred himself to treatment. Pt reported that at age [age]e had surgery on his other knee, and he was prescribed percocet again. Pt reported, "I never told the doctor I was an addict". Pt reported he was prescribed percocet for about 2-3 months and when this ran out he began using Roxy. Pt reported, "I snorted Roxy as often as I could". Pt stated that he would buy 8-10 pills at a time and that would last him about 2 days. Pt reported at this time he was using daily. Pt reported that he would feel "dope sick" when not using" Pt stated, "this has been my life the past 5 years". Pt reported that his current girlfriend is not  aware of his opiate addiction. He identified that if she were to learn about it there would be consequences for their relationship. Pt reported that his use as also impacted him job. Pt stated however that he feels he is more productive while using and often goes to work high. Pt reported that he frequently drives while under the influence as well. Pt stated, "I only keep as many pills in the car as I know I can eat if I get pulled over".  Current Use Pattern: Pt reported that he currently used percocet 7.5 mg pills. Pt reported that he gets as many pills as he can afford as often as he can. Pt reported that he typically buys 20-30 pills per week depending on his finances. Pt stated that his dealer has told him that he needs to  "cut down and let others buy from him". Pt stated that since moving to [place] from [place] over the past year he has noticed an increase in use as he has an increase in his income. Pt reported that he is spending about $300 per week on pills. He stated that last week he snorted 14 pills in one day, which is the most he has ever used.  Pt reported that his LU was 02/26/17,when he snorted 4 percocet pills.     Pt reports a history of benzo use.   Pt reports a history of alcohol use - LU 02/26/17 drank 1 beer  Pt reports a history of Marijuana use - LU 02/24/17 ate a brownie    Physical Health:         -- Is patient now in any physical pain? no       -- Is patient in withdrawal? yes, runny nose, sweating, sleep disturbances, irritability       -- IStop database has been checked       -- BAC result is:  0.00       -- Urine Toxicology Screen is:  Results pending    Visual/Motor functioning (based on your observation):  No observed deficits in hand-eye coordination or gait    Patient Psychiatric, Legal History:    Psychiatric:    ED Visits: pt denies  Mental Hygiene Arrests: pt denies  Are you currently receiving mental health care? Pt denies  Are you currently prescribed any psychiatric medications? Pt denies    Legal:  Pending legal charges or sentencing: No  Will legal status be a barrier to entering and remaining in treatment? : NA    (Please complete the LOCADTR with the patient)     Mental Status Exam:  MMS screen: MMS Total Score: 7 7  PHQ 9: 6  APPEARANCE: Appears stated age, Well-groomed  ATTITUDE TOWARD INTERVIEWER: Cooperative  MOTOR ACTIVITY: WNL (within normal limits)  EYE CONTACT: Direct  SPEECH: Normal rate and tone  AFFECT: Neutral  MOOD: Normal  THOUGHT PROCESS: Normal  THOUGHT CONTENT: No unusual themes  PERCEPTION: Within normal limits  CURRENT SUICIDAL IDEATION: patient denies  CURRENT HOMICIDAL IDEATION: Patient denies  ORIENTATION: Alert and Oriented X 3.  CONCENTRATION: WNL  MEMORY:   Recent:  intact   Remote: intact  COGNITIVE FUNCTION: Average intelligence  JUDGMENT: Intact  IMPULSE CONTROL: Fair  INSIGHT: Fair     Assessment of Risk For Suicidal Behavior:  The items prior to Risk Formulation and Summary in this assessment can guide the collection of relevant risk-related information. These data inform the Risk Formulation and Summary, which is the primary focus of this  assessment. Be sure to document the rationale (reasoning) behind your clinical judgment of risk.     [place] Scale administered? N/A    Predisposing Vulnerabilities:  Chronic substance abuse, Additional details or comments: manic depression bipolar    Non-Suicidal Self-Injury:  None reported by patient.     Recent Stressful Life Event(s):  Pt denies     Clinical Presentation:  Impulsivity, recklessness (incl. subjective feeling "out of control"), Mania/hypomania, Alcohol or drug intoxication     Access to Lethal Means (weapons/firearms, medications, other):  Yes - Girlfriend is aware, she has access as well she could move them      Opportunities for Crisis and Treatment Planning/Protective Factors:  Able to identify reasons for living, Does not view suicide as a personal option, Good physical health, Hopefulness, Perceived reasons to live are greater than reasons to die, Active engagement in treatment, Supportive relationships, Lives with a partner or other family, Future oriented     Engagement and Reliability:  Engagement with attempts to interview/help: good   Assessment of reliability of report: fair  Additional details or comments: PT provided collateral contacts for support of interview.     Suicide Risk Formulation and Summary:   Synthesize information gathered into an overall judgment of risk.     Overall Clinical Judgment of Risk: (Indicate your judgment of this individual's long and short term risk)   - Long-term./Chronic Risk: Low   - Short-term/Acute Risk: Low     Synthesis and Rationale  for Clinical Judgment of Risk: Describe: Pt denies any history of SA or self injury. Pt presents with several protective factors.      - Plan: Monitoring beyond usual for suicide risk not indicated at this time.       Assessment of Risk For Violent Behavior:     Current violence ideation: No  Current violence intent: No  Current violence plan: No  Recent (within past 8 weeks) violent or threatening thoughts or behaviors: No  Prior history of any violent or threatening behavior toward others: Yes  "7-8 years ago I had a restraining order against me by an ex-girlfriend for harassing her (calling her over and over again)"  Prior legal involvement (family, civil, or criminal) related to threatening or violent behavior: Yes - see above  Current involvement in a protection order proceeding: No  History of destruction to property: No; If yes, most recent date: NA     Violence Risk Formulation and Summary:  Synthesize information gathered into an overall judgment of risk.     Overall Clinical Judgment of Risk (indicate your judgment of this individual's long and short-term risk):    - Long-term./Chronic Risk: Low/Moderate   - Short-term/Acute Risk: Low/moderate     Synthesis and Rationale for Clinical Judgment of Risk: Describe: Pt reports a history including once incident related to a violent incident. Pt denies any current violent thoughts or behaviors.     - Plan: Monitoring beyond usual for violence risk not indicated at this time.     CRITERIA FOR SUBSTANCE USE DISORDER   Patient meets criteria for substance use disorder by exhibiting the following symptoms within a 12-monthperiod:  1. opioid is often taken in larger amounts or over a longer period than was intended.  2. There is a persistent desire or unsuccessful efforts to cut down or control opioid use.  3. A great deal of time is spent in activities necessary to obtain opioid, use opioid or recovery from its  effects.  4.  Craving, or a strong desire or urge to use opioid.  5. Recurrent opioid use resulting in a failure to fulfill major role obligations at work, school, or home.  6. Continued opioid use despite having persistent or recurrent social or interpersonal problems caused or exacerbated by the effects of the opioid.  7. Important social, occupational, or recreational activities are given up or reduced because of opioid use.  8. Recurrent opioid use in situations in which it is physically hazardous.  9. opioid use is continued despite knowledge of having a persistent or recurrent physical or psychological problem that is likely to have been caused or exacerbated by opioid.  10. Tolerance, as defined by either of the following: a. A need for markedly increased amounts of opioid to achieve intoxication or desired effect.  11. Withdrawal, as manifested by either of the following: a. The characteristic withdrawal syndrome for opioid.    Working Diagnosis:     ICD-10-CM ICD-9-CM   1. Opioid use disorder, severe, dependence F11.20 304.00        Opioid Use Risk Assessment  Is the person actively using opiates? Yes  Has the person ever met criteria for opioid use disorder? Yes  Was the patient trained and provided with Narcan? Yes   Was collateral contacted to discuss safety and use of Narcan? No  Was Medication Assisted Treatment discussed during the session? Yes patient is interested in suboxone   Do you currently use street methadone? No     *Safety Plan completed on paper due to computer issue, refer to media section.    Plan:  Patient will be admitted to [facility].  Treatment recommendations include: individual psychotherapy (CBT, PST, DBT, CPT, IPT) and group psychotherapy. Pt is scheduled to attend a Primary Care Screening an suboxone education on 1/14. Pt is then scheduled to attend orientation on 1/17 as well as the suboxone induction with Dr. Ulice Bold. Pt has requested the workers track for treatment.

## 2017-02-27 NOTE — BH Intake Assessment (Signed)
Level of Care Determination    PRELIMINARY ASSESSMENT  Client Substance with most potential to cause harm:  Opioids    DIAGNOSIS  The client meets 11 criteria for a severe substance use disorder  The client meets criteria for tolerance and/or withdrawal.    CRISIS DETOX  Does the person have serious psychiatric or medical symptoms that would require 24-hour  inpatient medical management in a hospital setting where a physician is in attendance daily?  h. None of these apply.    ADDITIONAL CLINICAL INFORMATION TO CONSIDER IN TREATMENT PLANNING  RISK FACTORS  The following are client risk factors endorsed during the interview.  All of the following apply  Medication-assisted treatment (MAT) available in the community.  Person is willing to utilize MAT on an outpatient basis.  Person is expected to stabilize on medication on an outpatient basis.    RESOURCES  The following are available resources endorsed during the interview.  Performing responsibilities in their work, social or family roles.  Strong self-efficacy or confidence that s/he can pursue recovery goals.  Connection to a social or family network.  Therapeutic alliance with at least one professional helper.  Person has stable access to food and shelter.    LEVEL OF CARE RECOMMENDED BY LOCADTR  Outpatient Treatment with Medication Assisted Treatment (MAT)  An [facility]AS-certified outpatient clinic that is also certified to prescribe and monitor addiction  medications including buprenorphine, naltrexone, alcamprosate, disulfiram, and others. OASAScertified  outpatient services have multi-disciplinary teams that include medical staff and a Public affairs consultantmedical  director. These programs provide the following procedures: group and individual counseling; education  about, orientation to, and opportunity for participation in, relevant and available self-help groups;  alcohol and substance abuse disease awareness and relapse prevention; HIV and other  communicable disease education,  risk assessment, supportive counseling and referral; and family  treatment. In addition, social and health care services, skill development in accessing community  services, activity therapies, information and education about nutritional requirements, and vocational  and educational evaluation must be available either directly or through written agreements. Procedures  are provided according to an individualized assessment and treatment plan.    NEED TO OVERRIDE LOCADTR RECOMMENDATION  There is a need to override LOCADTR recommendation for the following reason:  Not applicable - Pt is scheduled to engage in outpatient services with suboxone.    FINAL RECOMMENDED LEVEL OF CARE  Outpatient Treatment with Medication Assisted Treatment (MAT)  An [facility]ASAS-certified outpatient clinic that is also certified to prescribe and monitor addiction  medications including buprenorphine, naltrexone, alcamprosate, disulfiram, and others. OASAScertified  outpatient services have multi-disciplinary teams that include medical staff and a Public affairs consultantmedical  director. These programs provide the following procedures: group and individual counseling; education  about, orientation to, and opportunity for participation in, relevant and available self-help groups;  alcohol and substance abuse disease awareness and relapse prevention; HIV and other  communicable disease education, risk assessment, supportive counseling and referral; and family  treatment. In addition, social and health care services, skill development in accessing community  services, activity therapies, information and education about nutritional requirements, and vocational  and educational evaluation must be available either directly or through written agreements. Procedures  are provided according to an individualized assessment and treatment plan.

## 2017-03-05 ENCOUNTER — Encounter: Payer: Self-pay | Admitting: Registered Nurse

## 2017-03-05 ENCOUNTER — Ambulatory Visit: Payer: PRIVATE HEALTH INSURANCE | Admitting: Registered Nurse

## 2017-03-05 VITALS — BP 144/65 | HR 59 | Ht 72.0 in | Wt 267.0 lb

## 2017-03-05 DIAGNOSIS — F1120 Opioid dependence, uncomplicated: Secondary | ICD-10-CM

## 2017-03-05 NOTE — Progress Notes (Addendum)
[facility] Primary Care Screening - Adult     Duration:  25 minutes      03/05/2017      Patient Name:  Ronald Richard  Patient Date of BIrth:  04/18/1985  Patient Medical Record Number:  [id number]  Patient Phone (Home):  [phone] (home) [phone] (work)  Patient Mobile Phone:    Telephone Information:   Mobile [phone]     Patient Address:  [address]  [place] [address]      Health Screen:    Visit Vitals  BP 144/65   Pulse 59   Ht 1.829 m (6')   Wt 121.1 kg (267 lb)   BMI 36.21 kg/m       Name of Primary Care Provider:  Provider, None  Patient is actively engaged with PCP of record? No, provided list of accepting Primary Care Providers  Date of Last Physical Exam:  (last PE approximately 2 years ago, health screen 3 mo ago)  Physical exam is within last 12 months? No, patient referred to PCP    Past Medical History:   Diagnosis Date    Anxiety     Arthritis     Depression     Environmental allergies     seasonal    GERD (gastroesophageal reflux disease)     Substance abuse        Past Surgical History:   Procedure Laterality Date    ANTERIOR CRUCIATE LIGAMENT REPAIR Bilateral     right 2006 Left 2015       Medications/Vitamins/Supplements     No medications reported.          How many days worth of psych medications do you have left?  Not taking psych medications.    Are there refills available?   NA    Who prescribes these medications for you?  NA    Allergy History as of 03/05/17      No Known Allergies (drug, envir, food or latex)                Nutrition  Weight loss or gain of 10 pounds or more in the past 3 mo.?: Yes - Patient reports weight GAIN of 10 Lbs. or more in the past three months  Does the patient report a recent change in appetite?: No  Any other problems eating or drinking, if so comment.:  (denies)    Estimated body mass index is 36.21 kg/m as calculated from the following:    Height as of this encounter: 1.829 m (6').    Weight as of this encounter: 121.1 kg (267 lb).  Which  statement describes the patient with regard to BMI?  BMI outside normal limits, recommend follow up with PCP.    Which, if any, does the patient say describes him?  None    History   Smoking Status    Not on file   Smokeless Tobacco    Not on file       Does the patient use tobacco products?  No    Pain Assessment:    Do you have any ongoing pain problems?: No                        Fall Risk Screen:    Have you fallen in the last year?: No        Do you feel you are at risk of falling?: No  Infectious Disease Screen:    Have you ever been tested for Hepatitis?: No     Have you ever tested positive for tuberculosis?: No     Have you been tested for HIV?: No          After review of documentation and assessment during session, patient appears to be free of serious communicable disease that can be transmitted through ordinary contact with others.    Does patient engage in any of the following behaviors that put them at risk for infectious diseases such as:  unprotected sex, sex with multiple partners, IV drug abuse or sharing of hypodermic needles no    Patient was provided with written material on the prevention and treatment of communicable diseases, including viral hepatitis, sexually transmitted diseases and HIV/AIDS,  which includes counseling and education on the use of condoms, testing ,and pre- and post-exposure prophylaxis and treatment.  Communicable disease information provided to patient?:  (offered and patient declined )            Patient was offered a referral for:   HIV testing and                    __x_ accepted      _____declined   Viral Hepatitis testing and   ___x_accepted      _____declined  Patient indicated that he would like HIV and Hepatitis Testing, he does not currently have a PCP.  He indicated that he would follow up with a provider in this program once fully admitted to program    Patient indicated that they understood the above information and was advised that regardless of  the above findings, they should communicate their current condition with their PCP: yes    Recommended Health Services:  Based on the above Primary Care Screening, patient is in agreement with the recommendations for further evaluation.  PPD placed at today's visit. Patient advised to return to clinic in 48-72 hours to have PPD read.  Based upon clinical review and on patient interview it is determined that the patient has not had a physical exam within the past 12 months.  It has been recommended that the patient schedule a physical exam with their PCP.  Patient reports that they either do not have a current PCP or are no longer followed by their PCP of record.  Patient was provided with a listing of accepting [facility] PCP's including [facility] or was encouraged to contact their insurance company for a listing of accepting PCP's.     PPD Placed Left Forearm Lot No [id number] 01/06/2019

## 2017-03-05 NOTE — Progress Notes (Signed)
[facility] Opioid Treatment Note     Review  Duration:  30 minutes      HPI  Last use of Opioids (either prescribed or street): Oxycodone 7.5 mg around 11:30 am  Marijuana yesterday evening around 8 or 9 pm    Results from most recent toxicology screen:    Results for Ronald Richard, Ronald Richard (MRN [id number]) as of 03/05/2017 12:22   Ref. Range 02/27/2017 08:00   Amphetamine,UR Unknown NEG   Benzodiazepinen,UR Unknown NEG   THC Metabolite,UR Unknown POS   Cocaine/Metab,UR Unknown NEG   Ethanol,UR Unknown NEG   Opiates,UR Unknown NEG   Oxycodone/Oxymorphone,UR Unknown POS       Past Opioid Replacement Medications:   Methadone:    Highest methadone dose in the past:     [facility]    Street methadone: "years ago"  Used liquid and pill form, does not recall dose   Buprenorphine/Naloxone (Suboxone):    [facility]cribed [facility]    Street buprenorphine (Suboxone): over one year ago -approximately 16 mg  ISTOP  Ronald KenningJustin Richard - 0 Prescriptions      Allergies  No Known Allergies (drug, envir, food or latex)    Education re Opioid Replacement Treatment  Teaching re: purpose opioid replacement and difference between methadone and buprenorphine  Teach re method of taking sublingual medication.  Teaching re complications and side effects of methadone and buprenorphine.  Teaching re interaction of opioid replacement meds with alcohol and other drugs.    Assessment  After review of information, this patient is an appropriate buprenorphine candidate.    Review of Alcohol, Sedative, Hypnotic, Anxiolytic Safety Contract:    I have reviewed the [facility] Alcohol, Sedative, Hypnotic, Anxiolytic Safety Contract with the patient and have provided the patient an opportunity to ask questions relative to this treatment and the requirements of the program:  Yes    Plan  Printed educational material and given to patient., Orientation date and time given: Thursday 03/08/17, Induction date and instructions given: 03/13/17 9 am, Patient was informed  that on the day of induction, they should not drive., Patient either was appropriate insurance to pay for medication or has some other means of payment.  and Patient is in agreement with above plans.  Patient alert and oriented throughout session, patient engaged and answering questions appropriately.  No signs of sedation noted.    Patient reported that he has a high deductible insurance plan with an FSA, he is not certain if Suboxone will be covered or if he will need to meet a deductible.  Patient was encouraged to and indicated that he will contact his insurance to discuss coverage prior to induction appointment and inquire about out of pocket costs.

## 2017-03-08 ENCOUNTER — Ambulatory Visit: Payer: PRIVATE HEALTH INSURANCE | Admitting: Psychiatry

## 2017-03-08 ENCOUNTER — Ambulatory Visit: Payer: PRIVATE HEALTH INSURANCE

## 2017-03-09 ENCOUNTER — Telehealth: Payer: Self-pay

## 2017-03-09 NOTE — Telephone Encounter (Signed)
This Clinical research associatewriter reached out to [place] following his missed orientation session on 03/08/17. The phone number on file was off and went directly to voicemail. This Clinical research associatewriter left a message for [place] requesting that he reschedule.

## 2017-03-13 ENCOUNTER — Telehealth: Payer: Self-pay

## 2017-03-13 ENCOUNTER — Ambulatory Visit: Payer: PRIVATE HEALTH INSURANCE | Admitting: Psychiatry

## 2017-03-13 NOTE — Telephone Encounter (Signed)
Opened in error

## 2017-03-13 NOTE — Telephone Encounter (Signed)
This Clinical research associatewriter reached out to [name] a second time following his missed orientation session on 03/08/17. The phone number on file was off again and went directly to voicemail. This Clinical research associatewriter left a message for Ronald Richard requesting that he reschedule. This Clinical research associatewriter will send a letter to the address on file encouraging Ronald Richard to reschedule by 03/20/17 or his case will close at [facility].

## 2017-03-14 ENCOUNTER — Telehealth: Payer: Self-pay

## 2017-03-14 NOTE — Telephone Encounter (Signed)
This Clinical research associatewriter received a phone call from [name] and poke directly. Ronald Richard apologized for missing his orientation on 1/17, and requested to reschedule. This Clinical research associatewriter was able to reschedule Ronald Richard for orientation on 1/30 at 10 am. This Clinical research associatewriter informed Ronald Richard that he will need to have his PPD replanted on 1/30, and then he will need to return to the clinic on 1/1 to have the PPD read. This Clinical research associatewriter also scheduled Ronald Richard for a new suboxone induction appointment for 2/5 at 9 am. This Clinical research associatewriter informed Ronald Richard that if he misses these appointments again he will be in jeopardy of having his case closed at [facility]. Ronald Richard stated his understanding and hung up the phone normally.

## 2017-03-20 ENCOUNTER — Ambulatory Visit: Payer: Self-pay

## 2017-03-21 ENCOUNTER — Other Ambulatory Visit
Admission: RE | Admit: 2017-03-21 | Discharge: 2017-03-21 | Disposition: A | Discharge status: Home / Self Care | Payer: PRIVATE HEALTH INSURANCE | Source: Ambulatory Visit | Attending: Psychiatry | Admitting: Psychiatry

## 2017-03-21 ENCOUNTER — Ambulatory Visit: Payer: PRIVATE HEALTH INSURANCE

## 2017-03-21 DIAGNOSIS — F1120 Opioid dependence, uncomplicated: Secondary | ICD-10-CM

## 2017-03-21 DIAGNOSIS — Z0189 Encounter for other specified special examinations: Secondary | ICD-10-CM | POA: Insufficient documentation

## 2017-03-21 LAB — CHEM DEPEND + ETOH SCRN, URINE
Amphetamine,UR: NEGATIVE
Benzodiazepinen,UR: NEGATIVE
Cocaine/Metab,UR: NEGATIVE
Ethanol,UR: NEGATIVE
Opiates,UR: NEGATIVE
Oxycodone/Oxymorphone,UR: POSITIVE
THC Metabolite,UR: POSITIVE

## 2017-03-21 NOTE — Progress Notes (Signed)
PPD Placement note  Ronald KenningJustin Richard, 32 y.o. male is here today for placement of PPD test  Reason for PPD test: intake  Pt taken PPD test before: yes  Verified in allergy area and with patient that they are not allergic to the products PPD is made of (Phenol or Tween). yes   Is patient taking any oral or IV steroid medication now or have they taken it in the last month?  no  Has the patient ever received the BCG vaccine?: yes  Has the patient been in recent contact with anyone known or suspected of having active TB disease?:  no  O: Alert and oriented in NAD.  P:  PPD placed on 03/21/2017.Left forarm  Patient advised to return for reading within 48-72 hours.Friday or Saturday.Pt reports he will come back Friday at lunch time.   lot # [id number] exp 01/06/2019

## 2017-03-21 NOTE — Progress Notes (Signed)
[facility] Admission Form/Orientation to Service     Date: 03/21/2017   Time: 2:39 PM   PSYCH GROUP THERAPY    Is this a group service?:  Yes    Group Name:  Orientation  Group ID:  [id number]  Group Face to Face Duration:  60  Number of Participants:  3         Attended: Yes; Excused: N/A; Absent: N/A; Toxicology Screen: Yes     Admission Decision:    I have reviewed the pre-admission assessment and the level of care determination.  It has been decided that this person will be admitted to this service as: The individual is determined to have a substance use disorder based on the criteria in the most recent version of the Diagnostic and Statistical Manual (DSM) or the International Classification of Diseases (ICD), the patient reports no known or suspected infectious disease that can be a danger to others and that is spread through casual contact and the individual appears not to be in need of acute hospital care, acute psychiatric care, a higher level of chemical dependence treatment services or other intensive services that cannot be provided in conjunction with outpatient care or would prevent him/her from participating in a chemical dependence outpatient program.    Orientation to Service:    I have reviewed and provided the patient a copy of the following: Treatment Agreement, Potential Consequences of Premature Discharge, Program Rules, Program Schedules, Patient Rights, Program/Recovery Vocabulary Self Help (mtg. list supplied), Meeting Location, Summary of Federal Confidentiality, Goals for Treatment Worksheet, Family/Significant Other Programming Opportunities, Geologist, engineeringVoluntary Nature of Admission, Group Process/Group Norms, Medication Consent, Toxicology screen procedures, Program I.D. requirement, How to Print production plannercontact Primary Therapist, Tour of Clinic    Physical:    Physical completed less than or equal to the last twelve months:  No   Physical Exam Completed No  Copy of Physical has been requested  The patient was  seen by nursing staff for a brief medical screen     Patient understood the above information: Yes    Information reviewed in the Spanish language: N/A    Patient was admitted to the program within 72 hours of intake assessment:  No Comment:     Initial Treatment Plan Due: 04/19/17    Comments: [name] arrived on time for the orientation appointment. He was alert and oriented X3. He shared of this being his first treatment experience and he just moved here from [place]. He identified wanting to be on suboxone. He was educated on cross addiction. [name] is being recommended to the evening SUD track working with Dellia Nimsara Shatraw. He is scheduled for his comp assessment on 03/27/17 @ 2:30.

## 2017-03-23 NOTE — Progress Notes (Signed)
PPD Reading Note  PPD read and results entered in Erecord  Result: 0 mm induration.  Interpretation: negative  If test not read within 48-72 hours of initial placement, patient advised to repeat in other arm 1-3 weeks after this test.  Allergic reaction: no

## 2017-03-24 LAB — BUPRENORPHINE, UR QNT
BUPREN GLUC,UR QNT: <5 ng/mL
BUPRENORPHINE,UR QNT: <2 ng/mL
Creatinine,UR: 264 mg/dL (ref 20–300)
NORBUPREN GLUC,UR QNT: <5 ng/mL
NORBUPRENORPHINE,UR QNT: <2 ng/mL

## 2017-03-27 ENCOUNTER — Ambulatory Visit: Payer: PRIVATE HEALTH INSURANCE

## 2017-03-27 ENCOUNTER — Ambulatory Visit: Payer: PRIVATE HEALTH INSURANCE | Attending: Psychiatry | Admitting: Psychiatry

## 2017-03-27 VITALS — BP 151/82 | HR 60 | Ht 72.0 in | Wt 259.0 lb

## 2017-03-27 DIAGNOSIS — F1220 Cannabis dependence, uncomplicated: Secondary | ICD-10-CM

## 2017-03-27 DIAGNOSIS — F419 Anxiety disorder, unspecified: Secondary | ICD-10-CM | POA: Insufficient documentation

## 2017-03-27 DIAGNOSIS — F1020 Alcohol dependence, uncomplicated: Secondary | ICD-10-CM

## 2017-03-27 DIAGNOSIS — F1120 Opioid dependence, uncomplicated: Secondary | ICD-10-CM

## 2017-03-27 MED ORDER — BUPRENORPHINE HCL-NALOXONE HCL 8-2 MG SL FILM *I*
1.0000 | ORAL_FILM | Freq: Every day | SUBLINGUAL | 0 refills | Status: DC
Start: 2017-03-27 — End: 2017-03-28

## 2017-03-27 NOTE — BH Intake Assessment (Signed)
Comprehensive Treatment Planning Session   (Note to Health Information Management: When releasing this document, please send the document titled Level of Care Assessment, if present)      Duration:  75 minutes    Date of Service:  03/27/17    Family Hx    FAMILY OF ORIGIN:       --Who was the patient raised by? Both parents       --Number of siblings: 2 brothers       --Birth order:  oldest       --Any significant family events:  Denied any abuse; 1817 when his parents divorced    FAMILY OF PROCREATION:         --Number of marriages: Denied       --Number of children: Denied        --Other significant relationships: Current relationship       --Is a significant other available? no; If no, why: Ronald Richard has not informed his girlfriend he is in treatment            --Name: Ronald Richard            --Relationship: girlfriend            --Info from Significant Other (e.g. level of involvement in patient's drinking/drug use, patterns and behavioral changes when drinking or using, view of patient's life problems): N/A       --If no significant other available, has the patient given permission for phone contact: no    FAMILY PSYCHIATRIC AND SUBSTANCE USE HISTORY:  Psychiatric hospitalization: Denied  Psychiatric outpatient care: Denied   Suicide or Homicide in family: Denied  Family alcohol or drug use: Maternal Uncle - alcohol    Psychiatric:    Psychiatric Hospitalization: Denied  Psychiatric outpatient care: Denied currently - previously was treated in his mid 20's    Medical:   --Hospitalizations for medical illness (except for childbirth) in the last 2 years (list hospital, date(s), and reason for hospitalization): Denied    Domestic Violence Screen:    Do you feel safe in your current relationship? no    Have you been hit, kicked, punched, or otherwise hurt by a partner or a member of your household within the past 6 months? No.    Have you ever hit, kicked, punched, or otherwise hurt by a partner or a member of your household  within the past 6 months? No.    Is there a partner from a previous relationship who is currently making you feel unsafe? no    Has anyone ever accused you of being emotionally or physically abusive in a relationship?   no    (Please complete the Domestic Abuse and Neglect screening question on the Screening Navigator in addition to these questions)    Patient Questionnaire  CULTURAL:       --Primary Racial or Ethnic Identification: Caucasian       --Primary language used or spoken: English       --Born in [place]? yes; Place of birth (city): [place], [place]       --Which language was spoken in the home when growing up? English       --Primary Cultural Identity or Ancestry: Interior and spatial designerAmerican     SOCIAL/RECREATIONAL       --Free time activities: Sports and Other activities: is a Research scientist (physical sciences)referree for baseball, does crossfit     EDUCATIONAL/VOCATIONAL       --Highest grade completed in school: 4 yr. college degree       --  Favorite ways of learning new things: Listening and Hands on       --Employed outside the home? 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  No      SPIRITUAL       --Involved in a religion at this time? No       --When discouraged or feeling hopeless, what keeps him going? "my family"       --What does patient say the word spirituality means to him? "nothing"       --How important is spirituality to patient now?  not important       --Has patient's spirituality/religion made an impact on her recovery in the past? No    Legal:          --Arrests or convictions? no       --Any DWI or drug related arrests? no       --Incarceration: No       --Involved with N/A    Chemical Usage Pattern  PRIMARY SUBSTANCE OF USE: OPIATES    Nicotine: Denied    Alcohol:    Age of onset: 33 years old  Progression (include quantity, frequency, current and past route, evidence of tolerance and withdrawal:   Ronald Richard reported he first drank alcohol when he was at a party with friends in which he got  intoxicated. He reported he began to go to parties every other weekend and progressed into every weekend by the end of high school. He reported drinking both beer and liquor, "which ever we could get". He reported binge drinking when he did then and found it difficult to stop. He reported after high school he continued to drink every weekend and began to increase by 20 into an extended weekend from Thursday - Sunday. He reported it still was between a 12 pack or half of a fifth. He reported continuing this same pattern into 32 years old. He reported [age] years old he was in a relationship and didn't drink or use any substances. When they broke up, he reported going to drinking into his long weekends of drinking from Thursday - Sunday. He reported this pattern continued in his mid to late 20's till about 30. He reported the past year he has decreased his alcohol use to about every other weekend. He still reports drinking about 8-12 beers when he does drink or about 6-8 liquor drinks.   Current pattern (must include frequency and quantity: Saturday - 6-8 drinks    Cannabis:    Age of onset: 31 years old  Progression (include quantity, frequency, current and past route, evidence of tolerance and withdrawal:   Ronald Richard reported he first smoked marijuana when he was at a party with friends.. He reported he began to go to parties every other weekend and progressed into every weekend by the end of high school.  He reported after high school 101 - daily - about half an ounce a day same every day till 32 years old.   [age] quit. Back to daily use.   A year ago he reported decreasing his use to a "few times a month, maybe every other weekend". He reported usually smoking half a joint when he did smoke.   He reported he last   Current pattern (must include frequency and quantity: Sunday night - half a joint    Cocaine:    Age of onset: 32 years old   Progression (include  quantity, frequency, current and past route, evidence of  tolerance and withdrawal: He reported he first used cocaine at 32 years old when he was at party in which he used one line. He reported after that he used cocaine one to two times a year during his early 20's. This pattern was consistent until he was about 32 years old. He reported then he began to use cocaine about three out of four weekends a month. He reported during that time he would do about half a gram a night. That pattern continued until he was 32 years old. He reported usually using it with alcohol. He reported decreasing his use at 1 - "very rare, maybe once a month at that".   Current pattern (must include frequency and quantity: 1/26 - one line    Opiates:    "Pt reported beginning opiate use at age 69, starting to use oxycotin. Pt reported, " I was hanging out with people who did a lot of drugs". Pt reported that he was using only on weekends in the beginning, using 20,g-80mg  pills. Pt reported, "I didn't even like using at first because I would get sick. But then I just got used to it". Pt reported that he began using via inhalation. Pt reported, "I used it as a party drug". Pt stated that at age 32 he had knee surgery and was prescribed percocet 5mg . He reported that he took it orally for about 2 months and "I was calling my doctor to refill my prescription all the time". Pt reported, "this brought on my addiction I think". Pt reported that once he was no longer prescribed percocet he began using Vicodin and morphine as well as percocet from the street. Pt reported that he used opiates in addition to benzos "off and on" until age 89. Pt reported that he attempted to quit use and from age [age] he had 1.5 years of sobriety from opiates. Pt reported that his girlfriend at the time broke up with him and he felt as though he was going to relapse due to feeling depressed so he referred himself to treatment. Pt reported that at age 89 he had surgery on his other knee, and he was prescribed percocet again.  Pt reported, "I never told the doctor I was an addict". Pt reported he was prescribed percocet for about 2-3 months and when this ran out he began using Roxy. Pt reported, "I snorted Roxy as often as I could". Pt stated that he would buy 8-10 pills at a time and that would last him about 2 days. Pt reported at this time he was using daily. Pt reported that he would feel "dope sick" when not using" Pt stated, "this has been my life the past 5 years". Pt reported that his current girlfriend is not aware of his opiate addiction. He identified that if she were to learn about it there would be consequences for their relationship. Pt reported that his use as also impacted him job. Pt stated however that he feels he is more productive while using and often goes to work high. Pt reported that he frequently drives while under the influence as well. Pt stated, "I only keep as many pills in the car as I know I can eat if I get pulled over".  Current Use Pattern: Pt reported that he currently used percocet 7.5 mg pills. Pt reported that he gets as many pills as he can afford as often as he can. Pt reported that he  typically buys 20-30 pills per week depending on his finances. Pt stated that his dealer has told him that he needs to "cut down and let others buy from him". Pt stated that since moving to [place] from [place] over the past year he has noticed an increase in use as he has an increase in his income. Pt reported that he is spending about $300 per week on pills. He stated that last week he snorted 14 pills in one day, which is the most he has ever used.  Pt reported that his LU was 02/26/17,when he snorted 4 percocet pills."  Age of onset:   Progression (include quantity, frequency, current and past route, evidence of tolerance and withdrawal:  Last use was Sunday  Current pattern (must include frequency and quantity:     Synthetic Drugs: Denied    Benzodiazepines:    Age of onset: 29  Progression (include quantity, frequency,  current and past route, evidence of tolerance and withdrawal: He reported primarily   Party using while drinking  He reported using them "a lot in my early 20's". Stopped [age] late 20's "moderate use"   Current pattern (must include frequency and quantity: 1 year ago    Other Drugs:    Type: HALLUCINOGENS - Mushrooms    Age of onset: 32 year old  Progression (include quantity, frequency, current and past route, evidence of tolerance and withdrawal: 39 - 29 doing "heavily" about "three times a month"   Current pattern (must include frequency and quantity: 2016    [age] heavy use of ecastay - 10 years ago last use    Is the patient in withdrawal: no - took Suboxone for the first time today and is feeling better since this morning  If yes, what are the withdrawal symptoms:     INJECTING DRUG USE (indicate all that apply): Never    GAMBLING:    Has patient ever gambled? yes   Has patient ever gambled for more money that he planned? yes   Has patient ever lied to important people in his life about how much he gambles? yes    20-26 sports beting  - no recent history of gambling  **If patient answers "yes" to 2 or more questions, refer to Problem Gamblers Program at [phone] for further evaluation**     CHEMICAL DEPENDENCY TREATMENT HISTORY  Self-help participation:   Current: no   Past: yes  Treatment episodes: None - went to mental health therapy  Longest period of sobriety: 68 - 49 years old    Mental Status Exam    Any change from mental status presentation at initial screening: no    PHQ-9 SCORE:    MMS screen:    Appearance: Appears stated age  Attitude Toward Interviewer: Cooperative  Motor Activity: WNL (within normal limits)  Eye Contact: Direct  Speech: Normal rate and tone  Affect: Full Range and Appropriate  Mood: Normal  Thought Process: Normal  Thought Content: No unusual themes  Perception: Within normal limits  Orientation: Alert and Oriented X 3.  Concentration: WNL  Memory:   Recent: intact   Remote:  intact  Cognitive Function: Average intelligence  Judgment: Intact  Impulse Control: Fair  Insight: Good     Diagnostic Formulation and Summary  Summary of relevant data, brief listing of criteria which support diagnoses, discussion of initial treatment issues:       CRITERIA FOR SUBSTANCE USE DISORDER   Patient meets criteria for substance use disorder by exhibiting the  following symptoms within a 75-month period:    1. Alcohol is often taken in larger amounts or over a longer period than was intended.  2. There is a persistent desire or unsuccessful efforts to cut down or control alcohol use.  4. Craving, or a strong desire or urge to use alcohol and cocaine.  5. Recurrent alcohbol use resulting in a failure to fulfill major role obligations at work, school, or home.  6. Continued alcohol use despite having persistent or recurrent social or interpersonal problems caused or exacerbated by the effects of the alcohol.  7. Important social, occupational, or recreational activities are given up or reduced because of alcohol use.  8. Recurrent alcohol and cocaine use in situations in which it is physically hazardous.  9. alcohol and cocaine use is continued despite knowledge of having a persistent or recurrent physical or psychological problem that is likely to have been caused or exacerbated by alcohol.  10. Tolerance, as defined by either of the following: a. A need for markedly increased amounts of alcohol to achieve intoxication or desired effect.         Diagnosis:    No diagnosis found.    Problem Areas  Substance Abuse/Dependence:  Ronald Richard has had continued use of substances.     Social/Recreational/Leisure:  Could benefit from exploring local supports  Family:  Girlfriend is currently unaware he is in treatment  Legal:  N/A  Physical Health:  N/A  Mental/Emotional:  Reports a history of being diagnosed with Bipolar Depression. Is currently unmedicated    Spiritual:  N/A  Educational/Vocational/Employment: N/A -  employed full time  Problem Gambling:  Prior history  Other:  N/A    Session Content    Evidence-based or Clinical Intervention Used:  Cognitive Behavioral Therapy  Motivational Interviewing  Relapse Prevention    SESSION CONTENT:     Treatment plan initiated with patient? yes

## 2017-03-27 NOTE — Progress Notes (Signed)
[facility] PSYCHIATRIC EVALUATION AND MANAGEMENT         HISTORY     Chief Complaint/Reason for Encounter: suboxone induction    History of Presenting Illness: Patient reports beginning to use opiates at age 32 and reports that ' there is not much that he has not tried' in his past; has had no previous chemical dependency treatment, has struggled with depression and anxiety.  Feels that his main issues are opiates ( oxy) and marijuana, has a h/o binge drinking which he feels he is managing as he has managed to decrease his use over the past year.  Denies any h/o seizures, does report symptoms of withdrawal from alcohol.    On contact today states that this is his first chemical dependency program and he is hopeful that suboxone will work for his opiate addiction, reports trying suboxone ' from the street' and having no issues with it.     Past, Family, Social History:  [place], working full time as a Comptrollermechanical engineer at [employer] , has a girlfriend      Past Psychiatric History:     Bipolar Depression    Psychiatric Hospitalizations: denies     Psychiatric Outpatient: yes    [place] therapist [place] in [place] - last saw 2015   Intake at [facility] 12/25/16    CD Treatment:denies    Review of Past Psychiatric Med Trials:    seroquel - helped me sleep   lamictal - not sure if it worked  - was still using other drugs and drinking heavily    Family Psychiatric & Substance Use History:   Maternal Uncle - ' heavy alcoholic'   Youngest brother - marijuana    Review of Systems & Active Medical Problems:  (1 system = Problem Pertinent; 2-9 systems = Extended; 10 or more systems or some systems noted as "all others negative"= Complete)    See pertinent info as in HPI.     Current Medication Side Effects: denies    Last Alcohol & Drug Use:    Alcohol: 03/24/17 - vodka  Nicotine: denies  Cannabis: 03/21/17  Cocaine: 03/17/17  Opiates: 03/25/17 - oxy 5  pm  Synthetic Cannabinoids: denies  Synthetic Stimulants ("Bath Salts"): denies  Non-prescribed Benzodiazepines: 05/2016 - xanax  Mushrooms:2017  Acid/LSD:' twice in my life'  Ecstasy:age 50 - 21 ' very heavily'  Molly:  " used it maybe 5 times in my life  Meth: 2016  Adderall: 2016 - don't like, make me anxious    Longest sobriety: 1 1/2 years  - age 32     Checked [place] database today. No unexpected prescriptions were found in this database.     PHQ-2/9 02/27/2017   PHQ-9 Total Score 6       New Medical Hx: denies        PSYCHIATRIC  EXAMINATION   (1-5 bullets = Problem Focused; 6-8 bullets = Expanded Problem Focused; At least 9 bullets = Detailed; Completion of All bulleted items - Comprehensive). Note: Vital Signs are weighted as one system area)    BP 151/82    Pulse 60    Ht 1.829 m (6')    Wt 117.5 kg (259 lb)    BMI 35.13 kg/m     Appearance:  Casual  Manner:  Cooperative  Musculoskeletal:  WNL -  Speech:  Normal rate, Normal volume  Thought Process:  Normal  Description of Associations:  Goal  Directed, Logical   Description of Abnormal or Psychotic Thoughts (Includes Safety Risk):  Thought Content: No unusual themes  Perception: No evidence of hallucinations   Safety Risk: Suicidal Ideation: Patient Denies, Homicidal Ideation: Patient Denies  Description of Judgment and Insight:  Intact judgment  Good insight  Orientation:  Alert and Oriented X 3.  Memory (Recent/Remote):  intact  Attention/Concentration:  WNL  Language:  No evidence of impairment in language, comprehension, or expression noted on today's exam.  Fund of Knowledge:  Intact  Mood and Affect:  Normal mood  Appropriate affect    ASSESSMENT OF RISK FOR SUICIDAL BEHAVIOR  Changes in risk for suicide from baseline Formulation of Risk and/or previous intake, including newly identified risk, if any: none    ASSESSMENT OF RISK FOR VIOLENT BEHAVIOR  Changes in risk for violence from baseline Formulation of Risk and/or previous intake, including newly  identified risk, if any: none      MEDICAL DECISION MAKING     Diagnoses:    ICD-10-CM ICD-9-CM   1. Opioid use disorder, severe, dependence F11.20 304.00   2. Cannabis use disorder, moderate, dependence F12.20 304.30       Formulation:  Ronald Richard is a 32 y.o. Caucasian male with polysubstance use disorder, most notably opiates, cannabis and alcohol, who presents today for Suboxone induction.  Patient shared that he wanted to start buprenorphine/naloxone at this time because he had been unable to refrain from using oxy's and it is impacting his relationship with his girlfriend.   He has a long history of opioid use beginning at age 78 and is now complaining of withdrawal symptoms because his last use was on Sunday 03/25/17. Reviewed [facility] Note done on 03/05/17 by Jackelyn Poling and went over rules of program with him. COWS = 8. Given 2 mg suboxone strip, dose observed, and he tolerated this with some reduction of symptoms when re-assessed after about 30 minutes. COWS = 3 about 30 min after dose. Denies SI/HI/AH/VH, denies any current safety concerns, demonstrated good understanding of teaching done regarding buprenorphine as well as safety concerns regarding use of buprenorphine with benzos or alcohol.  Intersted in addressing his mental health concerns moving forward, stating that he has mood lability and is easily triggered to anger which we discussed briefly.       Medications/Medical Records/Labs/Diagnostic Tests Reviewed:  Current Outpatient Prescriptions   Medication Sig    buprenorphine-naloxone (SUBOXONE) 8-2 MG SL film Place 1 Film under the tongue daily   Max daily dose: 1 Film       Counseling/Coordination of Care:  [x]  Diagnostic results/impressions and/or recommended studies were discussed   [x]  Instruction for management/treatment/followup discussed  [x]  Risks and benefits of treatment options discussed  [x]  Education provided to patient/family/caregiver  []  Side effects and  benefits of his medications were discussed  []  Side effect management discussed  [x]  Adjustments of medication timing discussed  []  Risk factor reduction discussed  []  Prognosis discussed  [x]  Importance of adherence to treatment regimen discussed  []  Nicotine dependence and quitting were discussed.  []  Relapse prevention discussed    Plan:  Suboxone:  Given 2 mg SL at clinic - instructed to take 2 mg SL later this afternoon/evening                      Take 2 mg SL tomorrow morning    Return visit: 1 day - tomorrow at 12 noon    Length  of Visit:  60 minutes  Greater than 50 % of face to face time was spent in counseling/coordination of care

## 2017-03-28 ENCOUNTER — Ambulatory Visit: Payer: PRIVATE HEALTH INSURANCE | Admitting: Psychiatry

## 2017-03-28 VITALS — BP 134/69 | HR 50 | Ht 72.0 in | Wt 263.0 lb

## 2017-03-28 DIAGNOSIS — F1120 Opioid dependence, uncomplicated: Secondary | ICD-10-CM

## 2017-03-28 DIAGNOSIS — F1220 Cannabis dependence, uncomplicated: Secondary | ICD-10-CM

## 2017-03-28 MED ORDER — BUPRENORPHINE HCL-NALOXONE HCL 8-2 MG SL FILM *I*
1.0000 | ORAL_FILM | Freq: Every day | SUBLINGUAL | 0 refills | Status: DC
Start: 2017-03-28 — End: 2017-03-29

## 2017-03-28 NOTE — Progress Notes (Signed)
[facility] PSYCHIATRIC EVALUATION AND MANAGEMENT         HISTORY     Chief Complaint/Reason for Encounter: suboxone follow up    History of Presenting Illness: Patient reports beginning to use opiates at age 32 and reports that ' there is not much that he has not tried' in his past; has had no previous chemical dependency treatment, has struggled with depression and anxiety.  Feels that his main issues are opiates ( oxy) and marijuana, has a h/o binge drinking which he feels he is managing as he has managed to decrease his use over the past year.  Denies any h/o seizures, does report symptoms of withdrawal from alcohol.    On contact today states that he did well with the suboxone but did need to take the whole 8 mg between yesterday and returning to see writer today  - denies any use.    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      Past Psychiatric History:     Bipolar Depression    Psychiatric Hospitalizations: denies     Psychiatric Outpatient: yes    had a therapist and psychiatrist in [place] - last saw 2015   Intake at [facility] 12/25/16    CD Treatment:denies    Review of Past Psychiatric Med Trials:    seroquel - helped me sleep   lamictal - not sure if it worked  - was still using other drugs and drinking heavily    Family Psychiatric & Substance Use History:   Maternal Uncle - ' heavy alcoholic'   Youngest brother - marijuana    Review of Systems & Active Medical Problems:  (1 system = Problem Pertinent; 2-9 systems = Extended; 10 or more systems or some systems noted as "all others negative"= Complete)    See pertinent info as in HPI.     Current Medication Side Effects: denies    Last Alcohol & Drug Use:    Alcohol: 03/24/17 - vodka  Nicotine: denies  Cannabis: 03/21/17  Cocaine: 03/17/17  Opiates: 03/25/17 - oxy 5 pm  Synthetic Cannabinoids: denies  Synthetic  Stimulants ("Bath Salts"): denies  Non-prescribed Benzodiazepines: 05/2016 - xanax  Mushrooms:2017  Acid/LSD:' twice in my life'  Ecstasy:age 58 - 21 ' very heavily'  Molly:  " used it maybe 5 times in my life  Meth: 2016  Adderall: 2016 - don't like, make me anxious    Longest sobriety: 1 1/2 years  - age 59     Buprenorphine Follow Up:    Cravings: none  Side Effects: none  Toxicology: none since yesterday  Films Remaining: 0    [place] today. No unexpected prescriptions were found in this database.     PHQ-2/9 02/27/2017   PHQ-9 Total Score 6       New Medical Hx: denies        PSYCHIATRIC  EXAMINATION   (1-5 bullets = Problem Focused; 6-8 bullets = Expanded Problem Focused; At least 9 bullets = Detailed; Completion of All bulleted items - Comprehensive). Note: Vital Signs are weighted as one system area)    BP 134/69    Pulse 50    Ht 1.829 m (6')    Wt 119.3 kg (263 lb)    BMI 35.67 kg/m     Appearance:  Casual  Manner:  Cooperative  Musculoskeletal:  WNL -  Speech:  Normal rate, Normal volume, southern accent  Thought Process:  Normal  Description of Associations:  Goal Directed, Logical   Description of Abnormal or Psychotic Thoughts (Includes Safety Risk):  Thought Content: No unusual themes  Perception: No evidence of hallucinations   Safety Risk: Suicidal Ideation: Patient Denies, Homicidal Ideation: Patient Denies  Description of Judgment and Insight:  Intact judgment  Good insight  Orientation:  Alert and Oriented X 3.  Memory (Recent/Remote):  intact  Attention/Concentration:  WNL  Language:  No evidence of impairment in language, comprehension, or expression noted on today's exam.  Fund of Knowledge:  Intact  Mood and Affect:  Normal mood  Appropriate affect    ASSESSMENT OF RISK FOR SUICIDAL BEHAVIOR  Changes in risk for suicide from baseline Formulation of Risk and/or previous intake, including newly identified risk, if any: none    ASSESSMENT OF RISK FOR VIOLENT BEHAVIOR  Changes in  risk for violence from baseline Formulation of Risk and/or previous intake, including newly identified risk, if any: none      MEDICAL DECISION MAKING     Diagnoses:    ICD-10-CM ICD-9-CM   1. Opioid use disorder, severe, dependence F11.20 304.00   2. Cannabis use disorder, moderate, dependence F12.20 304.30       Formulation:  Ronald Richard is a 32 y.o. Caucasian male with polysubstance use disorder, most notably opiates, cannabis and alcohol, who presents today for follow up after suboxone induction 03/27/17.  States that he did well with the suboxone but felt that he needed the entire 8 mg and with that it addresses his cravings.  Denies any use sine yesterday.  Denies SI/HI/AH/VH, denies any current safety concerns, demonstrated good understanding of teaching done regarding buprenorphine as well as safety concerns regarding use of buprenorphine with benzos or alcohol.  Intersted in addressing his mental health concerns moving forward, stating that he has mood lability and is easily triggered to anger which we discussed briefly. And will follow up more fully in upcoming appointments      Medications/Medical Records/Labs/Diagnostic Tests Reviewed:  Current Outpatient Prescriptions   Medication Sig    buprenorphine-naloxone (SUBOXONE) 8-2 MG SL film Place 1 Film under the tongue daily   Max daily dose: 1 Film       Counseling/Coordination of Care:  [x]  Diagnostic results/impressions and/or recommended studies were discussed   [x]  Instruction for management/treatment/followup discussed  [x]  Risks and benefits of treatment options discussed  [x]  Education provided to patient/family/caregiver  []  Side effects and benefits of his medications were discussed  []  Side effect management discussed  [x]  Adjustments of medication timing discussed  []  Risk factor reduction discussed  []  Prognosis discussed  [x]  Importance of adherence to treatment regimen discussed  []  Nicotine dependence and quitting were discussed.  []   Relapse prevention discussed    Plan:  Suboxone:  8 mg daily SL  Return visit: 8 days at 1 pm     Length of Visit:  16 minutes  Greater than 50 % of face to face time was spent in counseling/coordination of care

## 2017-03-29 ENCOUNTER — Other Ambulatory Visit: Payer: Self-pay | Admitting: Psychiatry

## 2017-03-29 MED ORDER — BUPRENORPHINE HCL-NALOXONE HCL 8-2 MG SL FILM *I*
1.0000 | ORAL_FILM | Freq: Every day | SUBLINGUAL | 0 refills | Status: DC
Start: 2017-03-29 — End: 2017-04-05

## 2017-04-02 ENCOUNTER — Ambulatory Visit: Payer: PRIVATE HEALTH INSURANCE

## 2017-04-02 DIAGNOSIS — F1120 Opioid dependence, uncomplicated: Secondary | ICD-10-CM

## 2017-04-03 ENCOUNTER — Ambulatory Visit: Payer: PRIVATE HEALTH INSURANCE

## 2017-04-03 NOTE — Progress Notes (Signed)
[facility] Session Note     PSYCH GROUP THERAPY    Is this a group service?:  Yes    Group Name:  Relapse Prevention  Group ID:  [id number]  Group Face to Face Duration:  60  Number of Participants:  7      Eligibility for previously-prescribed controlled substance:  The patient is NOT currently eligible to have a monthly prescription for previously-prescribed controlled substance. To support safety and adherence to treatment regimen, the following interval is recommended:  One week supply         Overall Focus/Topic   Chemical Dependency  Relapse Prevention    Goal of Group Session  Todays group discussed motivation in treatment and what their goals may be for in it.       Toxicology Screen: no  Last reported/recorded use: N/A    Patient Participation/Response  Attitude Re Treatment: positive  Provided feedback: yes  Adhered to Group Norms: yes  Expressed Feelings: yes  Maintained Attention: yes  Self-disclosed Relevant Information: yes    Comment  [name] arrived to group on time and was orientated x3. He introduced himself and reported having a good weekend. He discussed needing to get the support with his addiction because it became uncontrollably and began to impact areas in his life.

## 2017-04-05 ENCOUNTER — Ambulatory Visit: Payer: PRIVATE HEALTH INSURANCE | Admitting: Psychiatry

## 2017-04-05 ENCOUNTER — Ambulatory Visit: Payer: PRIVATE HEALTH INSURANCE

## 2017-04-05 VITALS — BP 132/60 | HR 47 | Ht 72.0 in | Wt 268.0 lb

## 2017-04-05 DIAGNOSIS — F419 Anxiety disorder, unspecified: Secondary | ICD-10-CM

## 2017-04-05 DIAGNOSIS — F1120 Opioid dependence, uncomplicated: Secondary | ICD-10-CM

## 2017-04-05 MED ORDER — DOCUSATE SODIUM 100 MG PO CAPS *I*
100.0000 mg | ORAL_CAPSULE | Freq: Two times a day (BID) | ORAL | 1 refills | Status: DC
Start: 2017-04-05 — End: 2017-09-06

## 2017-04-05 MED ORDER — CLONIDINE HCL 0.1 MG PO TABS *I*
0.1000 mg | ORAL_TABLET | Freq: Two times a day (BID) | ORAL | 1 refills | Status: DC
Start: 2017-04-05 — End: 2017-05-29

## 2017-04-05 MED ORDER — BUPRENORPHINE HCL-NALOXONE HCL 8-2 MG SL FILM *I*
1.0000 | ORAL_FILM | Freq: Every day | SUBLINGUAL | 0 refills | Status: DC
Start: 2017-04-05 — End: 2017-04-13

## 2017-04-05 NOTE — Progress Notes (Signed)
[facility] PSYCHIATRIC EVALUATION AND MANAGEMENT         HISTORY     Chief Complaint/Reason for Encounter: suboxone follow up    History of Presenting Illness: Patient reports beginning to use opiates at age 32 and reports that ' there is not much that he has not tried' in his past; has had no previous chemical dependency treatment, has struggled with depression and anxiety.  Feels that his main issues are opiates ( oxy) and marijuana, has a h/o binge drinking which he feels he is managing as he has managed to decrease his use over the past year.  Denies any h/o seizures, does report symptoms of withdrawal from alcohol.    On contact today states that he is doing well with the suboxone but has noticed that he is having more trouble urinating which is bothering him - has had some issues with this in the past when he was not on suboxone.  Has noticed that he is also feeling a bit more anxious though this is not consistent.  Feels that he has a tendency to personalize things and be easily offended, finds that he is defensive and ' takes things too personal when someone questions him' - has gotten this feedback from friends and from people he is dating.     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[employer] , has a girlfriend      Past Psychiatric History:     Bipolar Depression    Psychiatric Hospitalizations: denies     Psychiatric Outpatient: yes    had a therapist and psychiatrist in [place] - last saw 2015   [facility]ake [facility] 12/25/16    CD Treatment:denies    Review of Past Psychiatric Med Trials:    seroquel - helped me sleep   lamictal - not sure if it worked  - was still using other drugs and drinking heavily    Family Psychiatric & Substance Use History:   Maternal Uncle - ' heavy alcoholic'   Youngest brother - marijuana    Review of Systems & Active Medical Problems:  (1 system = Problem  Pertinent; 2-9 systems = Extended; 10 or more systems or some systems noted as "all others negative"= Complete)    See pertinent info as in HPI.     Current Medication Side Effects: denies    Last Alcohol & Drug Use:    Alcohol: 03/24/17 - vodka  Nicotine: denies  Cannabis: 03/21/17  Cocaine: 03/17/17  Opiates: 03/25/17 - oxy 5 pm  Synthetic Cannabinoids: denies  Synthetic Stimulants ("Bath Salts"): denies  Non-prescribed Benzodiazepines: 05/2016 - xanax  Mushrooms:2017  Acid/LSD:' twice in my life'  Ecstasy:age 8 - 21 ' very heavily'  Molly:  " used it maybe 5 times in my life  Meth: 2016  Adderall: 2016 - don't like, make me anxious    Longest sobriety: 1 1/2 years  - age 32     Buprenorphine Follow Up:    Cravings: none  Side Effects: none  Toxicology:03/21/17   Films Remaining: 0    [place]. No unexpected prescriptions were found in this database.     PHQ 02/27/2017   PHQ-9 Total Score 6       New Medical Hx: denies        PSYCHIATRIC  EXAMINATION   (1-5 bullets = Problem Focused; 6-8 bullets = Expanded Problem Focused;  At least 9 bullets = Detailed; Completion of All bulleted items - Comprehensive). Note: Vital Signs are weighted as one system area)    BP 132/60    Pulse (!) 47    Ht 1.829 m (6')    Wt 121.6 kg (268 lb)    BMI 36.35 kg/m     Appearance:  Casual  Manner:  Cooperative  Musculoskeletal:  WNL -  Speech:  Normal rate, Normal volume, southern accent  Thought Process:  Normal  Description of Associations:  Goal Directed, Logical   Description of Abnormal or Psychotic Thoughts (Includes Safety Risk):  Thought Content: No unusual themes  Perception: No evidence of hallucinations   Safety Risk: Suicidal Ideation: Patient Denies, Homicidal Ideation: Patient Denies  Description of Judgment and Insight:  Intact judgment  Good insight  Orientation:  Alert and Oriented X 3.  Memory (Recent/Remote):  intact  Attention/Concentration:  WNL  Language:  No evidence of impairment in language,  comprehension, or expression noted on today's exam.  Fund of Knowledge:  Intact  Mood and Affect:  Normal mood  Appropriate affect    ASSESSMENT OF RISK FOR SUICIDAL BEHAVIOR  Changes in risk for suicide from baseline Formulation of Risk and/or previous intake, including newly identified risk, if any: none    ASSESSMENT OF RISK FOR VIOLENT BEHAVIOR  Changes in risk for violence from baseline Formulation of Risk and/or previous intake, including newly identified risk, if any: none      MEDICAL DECISION MAKING     Diagnoses:    ICD-10-CM ICD-9-CM   1. Opioid use disorder, severe, dependence F11.20 304.00   2. Anxiety F41.9 300.00       Formulation:  Ronald Richard is a 32 y.o. Caucasian male with polysubstance use disorder, most notably opiates, cannabis and alcohol, who presents today for follow up after suboxone induction 03/27/17.  States that he feels current suboxone dose is working well, has had no use and has no cravings.  Concerned about increased difficulty in urinating; as this was explored further patient revealed that he has had this issue in the past and has not followed up with a medical provider in over 5 years. Also talked about how, more recently, he has trouble urinating in a urinal, needs to use a stall.  Verbalizing some overall anxiety.  Denies any feelings of depression, denies any difficulty with sleep and appetite, although does not have a consistent sleep hygiene protocol and time to go to bed varies with  His girlfriend's work schedule.   Discussed being given feedback about being ' overly sensitive' and defensive which we discussed briefly. Mild complaints of constipation along with anxiety - agreeable to initiating clonidine 0.1 mg twice a day to target anxiety and colace as a stool softener.  Given a list of PCP's accepting new patients as he does not currently have a PCP.  Denies SI/HI/AH/VH, denies any safety concerns.  Continues to work full time and also has a part time job.       Medications/Medical Records/Labs/Diagnostic Tests Reviewed:  Current Outpatient Prescriptions   Medication Sig    buprenorphine-naloxone (SUBOXONE) 8-2 MG SL film Place 1 Film under the tongue daily   Max daily dose: 1 Film    cloNIDine (CATAPRES) 0.1 MG tablet Take 1 tablet (0.1 mg total) by mouth 2 times daily    docusate sodium (COLACE) 100 MG capsule Take 1 capsule (100 mg total) by mouth 2 times daily       Counseling/Coordination of  Care:  [x]  Diagnostic results/impressions and/or recommended studies were discussed   [x]  Instruction for management/treatment/followup discussed  [x]  Risks and benefits of treatment options discussed  [x]  Education provided to patient/family/caregiver  []  Side effects and benefits of his medications were discussed  []  Side effect management discussed  [x]  Adjustments of medication timing discussed  []  Risk factor reduction discussed  []  Prognosis discussed  [x]  Importance of adherence to treatment regimen discussed  []  Nicotine dependence and quitting were discussed.  []  Relapse prevention discussed    Plan:  Suboxone:  8 mg daily SL - weekly scripts  Start:  Clonidine 0.1 mg twice a day             Colace 100 mg twice  Day  Follow up with PCP re: urinary frequency    Return visit: 14 days at 1 pm     Length of Visit:  25 minutes  Greater than 50 % of face to face time was spent in counseling/coordination of care

## 2017-04-09 ENCOUNTER — Ambulatory Visit: Payer: PRIVATE HEALTH INSURANCE

## 2017-04-09 DIAGNOSIS — F1120 Opioid dependence, uncomplicated: Secondary | ICD-10-CM

## 2017-04-09 NOTE — Progress Notes (Signed)
[facility] Session Note     PSYCH GROUP THERAPY    Is this a group service?:  Yes    Group Name:  Relapse Prevention  Group ID:  [id number]  Group Face to Face Duration:  60  Number of Participants:  5      Eligibility for previously-prescribed controlled substance:  The patient is NOT currently eligible to have a monthly prescription for previously-prescribed controlled substance. To support safety and adherence to treatment regimen, the following interval is recommended:  One week supply         Overall Focus/Topic   Chemical Dependency  Relapse Prevention    Goal of Group Session  Todays group discussed identifying different triggers (i.e. locations, time of day, people, feelings).       Toxicology Screen: no  Last reported/recorded use: N/A    Patient Participation/Response  Attitude Re Treatment: positive  Provided feedback: yes  Adhered to Group Norms: yes  Expressed Feelings: yes  Maintained Attention: yes  Self-disclosed Relevant Information: yes    Comment  Ronald AlexandersJustin arrived to group on time and was orientated x3. He reported working over the weekend and relaxing the rest. He further participated in the group discussion about how movies are triggering due to him using to relax while watching them.

## 2017-04-10 ENCOUNTER — Other Ambulatory Visit
Admission: RE | Admit: 2017-04-10 | Discharge: 2017-04-10 | Disposition: A | Discharge status: Home / Self Care | Payer: PRIVATE HEALTH INSURANCE | Source: Ambulatory Visit | Attending: Psychiatry | Admitting: Psychiatry

## 2017-04-10 ENCOUNTER — Ambulatory Visit: Payer: PRIVATE HEALTH INSURANCE

## 2017-04-10 DIAGNOSIS — Z0189 Encounter for other specified special examinations: Secondary | ICD-10-CM | POA: Insufficient documentation

## 2017-04-10 DIAGNOSIS — F1120 Opioid dependence, uncomplicated: Secondary | ICD-10-CM

## 2017-04-11 DIAGNOSIS — F1120 Opioid dependence, uncomplicated: Secondary | ICD-10-CM

## 2017-04-11 DIAGNOSIS — Z639 Problem related to primary support group, unspecified: Secondary | ICD-10-CM

## 2017-04-11 DIAGNOSIS — F1220 Cannabis dependence, uncomplicated: Secondary | ICD-10-CM

## 2017-04-11 DIAGNOSIS — F419 Anxiety disorder, unspecified: Secondary | ICD-10-CM

## 2017-04-11 DIAGNOSIS — F1020 Alcohol dependence, uncomplicated: Secondary | ICD-10-CM

## 2017-04-11 LAB — CHEM DEPEND + ETOH SCRN, URINE
Amphetamine,UR: NEGATIVE
Benzodiazepinen,UR: NEGATIVE
Cocaine/Metab,UR: NEGATIVE
Ethanol,UR: NEGATIVE
Opiates,UR: NEGATIVE
Oxycodone/Oxymorphone,UR: NEGATIVE
THC Metabolite,UR: POSITIVE

## 2017-04-11 LAB — ETHYL GLUCURONIDE, UR: Ethyl glucuronide, Ur: NEGATIVE

## 2017-04-11 NOTE — BH Treatment Plan (Addendum)
[facility] Person-Centered Treatment Plan     Date of Plan:   [facility]:   Created/Updated On 04/11/2017   FROM 04/19/2017      Created/Updated On 04/11/2017   TO 06/17/2017       Service Location: [facility]     Diagnostic Impression    ICD-10-CM ICD-9-CM   1. Opioid use disorder, severe, dependence F11.20 304.00   2. Cannabis use disorder, moderate, dependence F12.20 304.30   3. Alcohol use disorder, severe, dependence F10.20 303.90   4. Anxiety F41.9 300.00   5. Relationship problems Z63.9 313.3       Patient Strengths and Limitations:   Social/Recreational:  Deferred   Ronald Richard lacks local supports and is wanting to engage in community resources. This will be addressed further into his treatment.  Family:  Limitation   Ronald Richard's family is not local and his girlfriend is unaware of his addiciton.   Physical Health:  N/A  Spiritual:  N/A  Gender Identity:  N/A  Sexual Orientation:  N/A  Education:  [employer]  Employment:  [employer]  Full time employment  Housing:  Sales executive housing  Community Integration:  N/A  Cultural/Language:  N/A  Outreach Services:  N/A  Other:  N/A  Additional Items:  Mental/Emotional:  Active  Substance Use:  Active  Legal:  N/A  Problem Gambling:  N/A      Domestic Violence:   Patient and/or identification tool has not identified the presence of domestic violence at this time.  ________________________________________________________________    Substance Use Treatment Goals:  Author: Dellia Nims, MA    MEDICATION SUPPORTED RECOVERY:  Opioid Replacement Therapy: Buprenorphine    COLLABORATIVE CARE SESSION:yes    DATE OF LAST PHYSICAL EXAM:  Not Applicable    Treatment Problem #1 04/11/2017   1ST TREATMENT PLAN PROBLEM Ronald Richard has struggles to abstain from use of substances, which has created negative consequences in his life.       Treatment Goal #1 04/11/2017   1ST TREATMENT PLAN GOAL Ronald Richard will abstain from all substances and develop a lifestyle of recovery.       The rationale for  addressing this problem is that resolving it will (select all that apply):  Reduce symptoms of disorder, Reduce functional impairment associated with disorder, Facilitate transfer skills learned in therapy to everday life and Is a key motivational factor for the patient's participation in treatment      Progress toward goal(s): N/A - Initial plan    1 a. Measurable Objectives : Ronald Richard would like to "be sober" as evidenced by obtaining and maintaining abstinence from all mood altering substances, as discussed in individual and group sessions.               Date established: 04/19/17              Target date: By Treatment Plan Review Date              Intervention(s): Staff will discuss progress in individual and group sessions, and identify barriers in recovery.    1 b. Measurable Objectives : Ronald Richard would like to "get help by being on Suboxone" as evidenced by attending his appointments with Eve Andersen-Buescher and taking medication as prescribed.    Date established: 04/19/17   Target date: By Treatment Plan Review Date   Intervention(s): Counselor will monitor his attendance and address any barriers if there are any to occur.         Treatment Problem #2 04/11/2017  2ND TREATMENT PLAN PROBLEM Ronald Richard has a history of mental health and is concerned about the impact it has had on his use and relationships.       Treatment Goal #2 04/11/2017   2ND TREATMENT PLAN GOAL Ronald Richard will develop an awareness of his mental health and the develop skills to cope without using substances.        The rationale for addressing this problem is that resolving it will (select all that apply):  Facilitate transfer skills learned in therapy to everday life and Is a key motivational factor for the patient's participation in treatment    Progress toward goal(s): N/A - Initial plan    2 a. Measurable Objectives : Ronald Richard is interested in "staying positive to not get depressed or angry" as evidenced by identifying his mental health symptoms and  verbalizing more constructive means of coping.               Date established: 04/19/17              Target date: By Treatment Plan Review Date              Intervention(s): Counselor will educate Ronald Richard about symptoms of depression and anger to help her gain an understanding of her own and further process healthier, more constructive means of coping.     2 b. Measurable Objectives : Ronald Richard would like to get further evaluated as evidenced by attending her appointments with the psychiatrist and taking medications as prescribed.    Date established: 04/19/17   Target date: By Treatment Plan Review Date   Intervention(s): Counselor will refer writer to the psychiatrist and follow up with any barriers to her appointments. Writer will encourage her to take medications as prescribed.    ..........................................................................................................................................    Treatment Problem #3 04/11/2017   3RD TREATMENT PLAN PROBLEM Ronald Richard has limited family support and has yet to tell his girlfriend about his addiction and treatment.       Treatment Goal #3 04/11/2017   3RD TREATMENT PLAN GOAL Ronald Richard will further engage his girlfriend and family in his recovery.        The rationale for addressing this problem is that resolving it will (select all that apply):  Facilitate transfer skills learned in therapy to everday life    Progress toward goal(s): N/A - Initial plan    3 a. Measurable Objectives : Ronald Richard will understand his hesitation to tell his girlfirned as evidenced by identifying his feelings and thoughts about including her in about his addiction struggles, as well as the impact this could have on his goal of recovery.    Date established: 04/19/17   Target date: By Treatment Plan Review Date   Intervention(s): Counselor will assist him in exploring his feelings about not disclosing to his girlfriend his struggle with addiction and encourage him to include her in  his recovery.  _____________________________________________________________________    Plan  TREATMENT MODALITIES:  Individual psychotherapy for 45 min Q 3 weeks with Dellia Nims, MA.  Relapse Prevention  group psychotherapy for 60 min Q once a weeks with Dellia Nims, MA.  Buprenorphine prescribed by Billy Fischer prescription frequency Q once a  weeks frequency of visits Q once a  weeks   [facility] group psychotherapy for 90 min Q twice a week with Dellia Nims, MA  Primary Care screening yearly.    [facility] PRIMARY CARE SCREEN 03/05/2017   [facility] Primary Care Screen Date 03/05/2017  No flowsheet data found.    Patient was provided with written material on the prevention and treatment of communicable diseases, including viral hepatitis, sexually transmitted diseases and HIV/AIDS,  which includes counseling and education on the use of condoms, testing ,and pre- and post-exposure prophylaxis and treatment.                 Is the patient a veteran? No    DISCHARGE CRITERIA for this treatment setting: N/A    Has the Safety Plan been created/reviewed for this review period? Yes     Coordination of Care:   PCP: Provider, None  Care Management: N/A  Type of provider(s): N/A  Provider Info (Agency name, contact person/phone number) and Frequency of Contact (weekly, monthly, quarterly, etc.): N/A    Discussion with patient if there is a desire to change advanced directive.  No    Emergency Plan has been reviewed.    Patient/Family Statement  PATIENT/FAMILY STATEMENT:  Obtain patient and family input into the treatment plan, including areas of agreement / disagreement.  Obtain patient's signature - if not possible, briefly describe the reason.     Patient Comments:  None    I HAVE PARTICIPATED IN THE DEVELOPMENT OF THIS TREATMENT PLAN AND I AGREE WITH ITS CONTENTS:       Patient Signature: _______________________________________________________    Date: ______________________________

## 2017-04-11 NOTE — BH Treatment Plan (Signed)
Reviewed Jaquilla Woodroof, RN

## 2017-04-11 NOTE — Progress Notes (Signed)
[facility] Session Note     PSYCH GROUP THERAPY    Is this a group service?:  Yes    Group Name:  Early Recovery  Group ID:  [id number]  Group Face to Face Duration:  60  Number of Participants:  5      Eligibility for previously-prescribed controlled substance:  The patient is NOT currently eligible to have a monthly prescription for previously-prescribed controlled substance. To support safety and adherence to treatment regimen, the following interval is recommended:  One week supply         Overall Focus/Topic   Chemical Dependency  Relapse Prevention    Goal of Group Session  Todays group discussed developing a foundation for one's recovery. This included identifying their motives, feelings, coping with emotions, and identifying support systems. .       Toxicology Screen: no  Last reported/recorded use: 2/17 - marijuana    Patient Participation/Response  Attitude Re Treatment: positive  Provided feedback: yes  Adhered to Group Norms: yes  Expressed Feelings: yes  Maintained Attention: yes  Self-disclosed Relevant Information: yes    Comment  [name] arrived to group on time and was orientated x3. He introduced himself as it was his first day in group. He reported working part of the weekend and relaxed the rest. He participated in the group discussion in which he provided examples in which his support systems have been different since struggling with his addiction and is trying to transition to a new environment.

## 2017-04-12 ENCOUNTER — Ambulatory Visit: Payer: PRIVATE HEALTH INSURANCE

## 2017-04-12 DIAGNOSIS — F1120 Opioid dependence, uncomplicated: Secondary | ICD-10-CM

## 2017-04-12 LAB — THC SEMI-QUANT, URINE
Semi-Quant THC,UR: 180 ng/mL
THC/Creat Ratio: 146 ng/mg

## 2017-04-13 ENCOUNTER — Other Ambulatory Visit: Payer: Self-pay | Admitting: Psychiatry

## 2017-04-13 MED ORDER — BUPRENORPHINE HCL-NALOXONE HCL 8-2 MG SL FILM *I*
1.0000 | ORAL_FILM | Freq: Every day | SUBLINGUAL | 0 refills | Status: DC
Start: 2017-04-13 — End: 2017-04-16

## 2017-04-13 NOTE — Progress Notes (Signed)
Patient called requesting a refill - states he was having trouble with refill line - refill sent in as patient is due

## 2017-04-15 LAB — BUPRENORPHINE, UR QNT
BUPREN GLUC,UR QNT: 317 ng/mL
BUPRENORPHINE,UR QNT: 19 ng/mL
Creatinine,UR: 123 mg/dL (ref 20–300)
NORBUPREN GLUC,UR QNT: 328 ng/mL
NORBUPRENORPHINE,UR QNT: 67 ng/mL
TOTAL BUP/CREAT, UR QNT: 202 ng/mg
TOTAL BUPREN,UR QNT: 249 ng/mL
TOTAL NORBUP/CREAT,UR QNT: 241 ng/mg
TOTAL NORBUPREN,UR QNT: 297 ng/mL

## 2017-04-16 ENCOUNTER — Other Ambulatory Visit: Payer: Self-pay | Admitting: Psychiatry

## 2017-04-16 ENCOUNTER — Ambulatory Visit: Payer: PRIVATE HEALTH INSURANCE

## 2017-04-16 DIAGNOSIS — F1120 Opioid dependence, uncomplicated: Secondary | ICD-10-CM

## 2017-04-16 NOTE — Telephone Encounter (Signed)
buprenorphine-naloxone (SUBOXONE) 8-2 MG SL film, Place 1 Film under the tongue daily   Max daily dose: 1 Film     [facility] - [place], [place] - [address] [phone] (Phone)  [phone] (Fax)

## 2017-04-16 NOTE — Progress Notes (Signed)
[facility] Individual Progress Note     Duration:  45 minutes      Contact Type:  Location: On Site    Face to Face     Eligibility for previously-prescribed controlled substance:  The patient is NOT currently eligible to have a monthly prescription for previously-prescribed controlled substance. To support safety and adherence to treatment regimen, the following interval is recommended:  One week supply       Last Toxicology Screen Date: 04/10/17     Last reported/recorded use: 04/07/17 - marijuana      Treatment/Recovery Goals Addressed:    Treatment Problem #1 04/11/2017   1ST TREATMENT PLAN PROBLEM Ronald Richard has struggles to abstain from use of substances, which has created negative consequences in his life.     Treatment Goal #1 04/11/2017   1ST TREATMENT PLAN GOAL Ronald Richard will abstain from all substances and develop a lifestyle of recovery.     Treatment Problem #2 04/11/2017   2ND TREATMENT PLAN PROBLEM Ronald Richard has a history of mental health and is concerned about the impact it has had on his use and relationships.     Treatment Goal #2 04/11/2017   2ND TREATMENT PLAN GOAL Ronald Richard will develop an awareness of his mental health and the develop skills to cope without using substances.      Treatment Problem #3 04/11/2017   3RD TREATMENT PLAN PROBLEM Ronald Richard has limited family support and has yet to tell his girlfriend about his addiction and treatment.     Treatment Goal #3 04/11/2017   3RD TREATMENT PLAN GOAL Ronald Richard will further engage his girlfriend and family in his recovery.        Stress/Use/Mood/Risk Evaluation:    Stress     How have you been feeling?  "Good"      Whats better since I last saw you?  "The medication is till helping"     Has anything happened to increase your stress since the last time we met?  No         Would you like to talk about any of those things today?  N/A     On a scale of 10, with 10 as highest, how would you rate your current stress level? N/A     Use     Have you relapsed since  we last met and what were the circumstances surrounding the relapse?  Yes marijuana      Have you used any AOD at all in the last week?  marijuana      Have you increased your use due to stressors since we last met?  No       Mood     Are you feeling depressed or anxious?  No     Any trouble with disturbing or negative thoughts?  No         Risk (suicidal or homicidal)     Any thoughts of harming yourself or others?  Self Injurious Behavior: Patient Denies, Suicidal Ideation: Patient Denies, Homicidal Ideation: Patient Denies, Aggressive Behavior: Patient Denies      Domestic Violence Screen:  No indication to update domestic violence screen.    Session Content  Treatment plan developed with patient  Treatment plan reviewed with patient  Patient is in agreement with treatment plan   Ronald Richard declined a copy of his treatment plan    Evidence-based or Clinical Intervention Used:  Cognitive Behavioral Therapy  Motivational Interviewing  Family Counseling    Session summary/response to session:  Ronald Richard arrived to his session on time and as orientated x3. He reported feeling well but has been busy with work. He discussed a project he is working on at work that has been more difficult then he thought. He denied being triggered by this but was able to identify how he would have used during this time to cope except he hasn't had cravings lately. He was hoping to complete it tomorrow and not have to work this weekend.    Ronald Richard further discussed his relationship. He clarified to Probation officer that she does know he is in treatment but believes he is here for his mental health and quit marijuana. He further discussed when he returned to opiates and how he has kept it from her for a year. He identified his reasoning for this as he was afraid of her response and doesn't want to create further trust issues. He did report thinking lately how he would go about this conversation and practiced it with Probation officer. Writer also discussed  family therapy services that could be beneficial for him as well and her when they are ready.     Writer further reviewed his treatment plan with him, which he was agreeable. He declined wanting a copy.     Plan  Continue Treatment as planned.    Current Treatment Plan   Created/Updated On 04/11/2017   Next Treatment Plan Due 06/17/2017       (Please complete the Domestic Abuse and Neglect screening question on the Screening Navigator during treatment plan review session)

## 2017-04-17 ENCOUNTER — Telehealth: Payer: Self-pay

## 2017-04-17 ENCOUNTER — Ambulatory Visit: Payer: PRIVATE HEALTH INSURANCE

## 2017-04-17 NOTE — Telephone Encounter (Signed)
Writer received a voicemail from patient who reported he had an situation occur at work and was unable to leave in time to make it to group.

## 2017-04-17 NOTE — Progress Notes (Signed)
[facility] Session Note     PSYCH GROUP THERAPY    Is this a group service?:  Yes    Group Name:  Relapse Prevention  Group ID:  [id number]  Group Face to Face Duration:  60  Number of Participants:  4      Eligibility for previously-prescribed controlled substance:  The patient is NOT currently eligible to have a monthly prescription for previously-prescribed controlled substance. To support safety and adherence to treatment regimen, the following interval is recommended:  One week supply         Overall Focus/Topic   Chemical Dependency  Relapse Prevention    Goal of Group Session  Todays group discussed self talk, negative thoughts, and how it can impact recovery goals.      Toxicology Screen: no  Last reported/recorded use: 2/23 - alcohol, marijuana    Patient Participation/Response  Attitude Re Treatment: positive  Provided feedback: yes  Adhered to Group Norms: yes  Expressed Feelings: yes  Maintained Attention: yes  Self-disclosed Relevant Information: yes    Comment  Ronald AlexandersJustin arrived to group on time and was orientated x3. He reported being at a friends house with his girlfriend and had some 3-4 mixed drinks and marijuana. He reported spending the rest of his weekend doing errands. He further participated in the group discussion and was able to identify how his thoughts would impact his struggle with abstaining

## 2017-04-19 ENCOUNTER — Ambulatory Visit: Payer: PRIVATE HEALTH INSURANCE | Admitting: Psychiatry

## 2017-04-19 ENCOUNTER — Ambulatory Visit: Payer: PRIVATE HEALTH INSURANCE

## 2017-04-19 VITALS — BP 115/59 | HR 48 | Ht 72.0 in | Wt 268.0 lb

## 2017-04-19 DIAGNOSIS — F1120 Opioid dependence, uncomplicated: Secondary | ICD-10-CM

## 2017-04-19 DIAGNOSIS — F419 Anxiety disorder, unspecified: Secondary | ICD-10-CM

## 2017-04-19 DIAGNOSIS — F1020 Alcohol dependence, uncomplicated: Secondary | ICD-10-CM

## 2017-04-19 DIAGNOSIS — F1220 Cannabis dependence, uncomplicated: Secondary | ICD-10-CM

## 2017-04-19 MED ORDER — ESCITALOPRAM OXALATE 10 MG PO TABS *I*
10.0000 mg | ORAL_TABLET | Freq: Every day | ORAL | 1 refills | Status: DC
Start: 2017-04-19 — End: 2017-05-17

## 2017-04-19 MED ORDER — BUPRENORPHINE HCL-NALOXONE HCL 8-2 MG SL FILM *I*
1.0000 | ORAL_FILM | Freq: Every day | SUBLINGUAL | 0 refills | Status: DC
Start: 2017-04-19 — End: 2017-04-26

## 2017-04-19 NOTE — Progress Notes (Signed)
[facility] PSYCHIATRIC EVALUATION AND MANAGEMENT         HISTORY     Chief Complaint/Reason for Encounter: suboxone follow up    History of Presenting Illness: Patient reports beginning to use opiates at age 32 and reports that ' there is not much that he has not tried' in his past; has had no previous chemical dependency treatment, has struggled with depression and anxiety.  Feels that his main issues are opiates ( oxy) and marijuana, has a h/o binge drinking which he feels he is managing as he has managed to decrease his use over the past year.  Denies any h/o seizures, does report symptoms of withdrawal from alcohol.    On contact today states that he is doing well with the suboxone and feels as if this dose is holding him.  Initially denied any use, as conversation progressed stated that he has not used cocaine or opiates but has used marijuana and cannabis, feels that he is unlikely to stop smoking marijuana as it helps him ' get going' in the morning; at the same time feels he should stop as his girlfriend believes he is no longer smoking and he doesn't like lying to her.  Has not found a PCP yet, no change in urinary frequency issue.     Past, Family, Social History:  [place], working full time as a Comptrollermechanical engineer at [employer] , has a girlfriend      Past Psychiatric History:     Bipolar Depression    Psychiatric Hospitalizations: denies     Psychiatric Outpatient: yes    [place] in [place] - last saw 2015   Intake at [facility]/25/[facility]    CD Treatment:denies    Review of Past Psychiatric Med Trials:    seroquel - helped me sleep   lamictal - not sure if it worked  - was still using other drugs and drinking heavily    Family Psychiatric & Substance Use History:   Maternal Uncle - ' heavy alcoholic'   Youngest brother - marijuana    Review of Systems & Active Medical Problems:  (1 system = Problem Pertinent; 2-9  systems = Extended; 10 or more systems or some systems noted as "all others negative"= Complete)    See pertinent info as in HPI.     Current Medication Side Effects: denies    Last Alcohol & Drug Use:    Alcohol: 04/14/17 - vodka, rum  Nicotine: denies  Cannabis: 04/19/17  Cocaine: 03/17/17  Opiates: 03/25/17 - oxy 5 pm  Synthetic Cannabinoids: denies  Synthetic Stimulants ("Bath Salts"): denies  Non-prescribed Benzodiazepines: 05/2016 - xanax  Mushrooms:2017  Acid/LSD:' twice in my life'  Ecstasy:age 35 - 21 ' very heavily'  Molly:  " used it maybe 5 times in my life  Meth: 2016  Adderall: 2016 - don't like, make me anxious    Longest sobriety: 1 1/2 years  - age 32     Buprenorphine Follow Up:    Cravings: none  Side Effects: none  Toxicology:04/10/17  - (+) THC  Films Remaining: 0    Checked [place] database today. No unexpected prescriptions were found in this database.     PHQ 02/27/2017   PHQ-9 Total Score 6       New Medical Hx: denies        PSYCHIATRIC  EXAMINATION   (1-5 bullets = Problem Focused; 6-8 bullets =  Expanded Problem Focused; At least 9 bullets = Detailed; Completion of All bulleted items - Comprehensive). Note: Vital Signs are weighted as one system area)    BP 115/59    Pulse (!) 48    Ht 1.829 m (6')    Wt 121.6 kg (268 lb)    BMI 36.35 kg/m     Appearance:  Casual  Manner:  Cooperative  Musculoskeletal:  WNL -  Speech:  Normal rate, Normal volume, southern accent  Thought Process:  Normal  Description of Associations:  Goal Directed, Logical   Description of Abnormal or Psychotic Thoughts (Includes Safety Risk):  Thought Content: No unusual themes  Perception: No evidence of hallucinations   Safety Risk: Suicidal Ideation: Patient Denies, Homicidal Ideation: Patient Denies  Description of Judgment and Insight:  Intact judgment  Good insight  Orientation:  Alert and Oriented X 3.  Memory (Recent/Remote):  intact  Attention/Concentration:  WNL  Language:  No evidence of impairment in language,  comprehension, or expression noted on today's exam.  Fund of Knowledge:  Intact  Mood and Affect:  Normal mood  Appropriate affect    ASSESSMENT OF RISK FOR SUICIDAL BEHAVIOR  Changes in risk for suicide from baseline Formulation of Risk and/or previous intake, including newly identified risk, if any: none    ASSESSMENT OF RISK FOR VIOLENT BEHAVIOR  Changes in risk for violence from baseline Formulation of Risk and/or previous intake, including newly identified risk, if any: none      MEDICAL DECISION MAKING     Diagnoses:    ICD-10-CM ICD-9-CM   1. Opioid use disorder, severe, dependence F11.20 304.00   2. Cannabis use disorder, moderate, dependence F12.20 304.30   3. Alcohol use disorder, severe, dependence F10.20 303.90   4. Anxiety F41.9 300.00       Formulation:  Ronald Richard is a 32 y.o. Caucasian male with polysubstance use disorder, most notably opiates, cannabis and alcohol, who presents today for follow up after suboxone induction 03/27/17.  States that he feels current suboxone dose is working well, has had no use of and has no cravings for opiates. Has used alcohol and cannabis and we discussed issues and concerns about this - states that he feels he would not have a problem stopping alcohol but enjoys smoking marijuana - likes getting high and also feels it helps get him going in the morning.  Reports ongoing periods of anxiety, does feel clonidine is somewhat helpful but still struggling with this - agreeable to initiating lexapro 10 mg daily to address this - informed that he needs to take this consistently for it to work ( has not been taking colace consistently and does understand that this may be why he has not noticed much of a difference in his bowel movements).  If this is ineffective, will consider venlafaxine in the future for anxiety and motivation.  Regarding cannabis use, discussed with patient that cannabis use can increase anxiety as well as cause paranoia which he has stated he sometimes  has - although ambivalent about stopping cannabis, is willing to consider purchasing NAC OTC to address cannabis cravings and use - dosing discussed with him.  Denies SI/HI/AH/VH, denies any safety concerns.  Continues to work full time and also has a part time job.     Medications/Medical Records/Labs/Diagnostic Tests Reviewed:  Current Outpatient Prescriptions   Medication Sig    buprenorphine-naloxone (SUBOXONE) 8-2 MG SL film Place 1 Film under the tongue daily   Max daily dose: 1  Film    escitalopram (LEXAPRO) 10 MG tablet Take 1 tablet (10 mg total) by mouth daily    cloNIDine (CATAPRES) 0.1 MG tablet Take 1 tablet (0.1 mg total) by mouth 2 times daily    docusate sodium (COLACE) 100 MG capsule Take 1 capsule (100 mg total) by mouth 2 times daily       Counseling/Coordination of Care:  [x]  Diagnostic results/impressions and/or recommended studies were discussed   [x]  Instruction for management/treatment/followup discussed  [x]  Risks and benefits of treatment options discussed  [x]  Education provided to patient/family/caregiver  []  Side effects and benefits of his medications were discussed  []  Side effect management discussed  [x]  Adjustments of medication timing discussed  []  Risk factor reduction discussed  []  Prognosis discussed  [x]  Importance of adherence to treatment regimen discussed  []  Nicotine dependence and quitting were discussed.  []  Relapse prevention discussed    Plan:  Suboxone:  8 mg daily SL - weekly scripts  Start:  lexapro 10 mg daily  continue:  Clonidine 0.1 mg twice a day                   Colace 100 mg twice  Day  Follow up with PCP re: urinary frequency - has not found a new PCP yet - recommended to call SIM  Purchase and start;  NAC 1200 mg twice a day    Return visit: 14 days at 12 pm     Length of Visit:  25 minutes  Greater than 50 % of face to face time was spent in counseling/coordination of care

## 2017-04-23 ENCOUNTER — Telehealth: Payer: Self-pay

## 2017-04-23 ENCOUNTER — Ambulatory Visit: Payer: PRIVATE HEALTH INSURANCE | Admitting: Psychiatry

## 2017-04-23 NOTE — Telephone Encounter (Signed)
Pt has called to cancel scheduled group appointment on 3/4 with Carlena Bjornstadavid Fortuna. Pt is unable to make scheduled appointment due to work. Message routed to provider as Lorain ChildesFYI.

## 2017-04-24 ENCOUNTER — Telehealth: Payer: Self-pay

## 2017-04-24 ENCOUNTER — Ambulatory Visit: Payer: PRIVATE HEALTH INSURANCE | Admitting: Behavioral Health

## 2017-04-24 NOTE — Telephone Encounter (Signed)
Pt cancelled group session for today. He is working late.

## 2017-04-26 ENCOUNTER — Other Ambulatory Visit: Payer: Self-pay | Admitting: Psychiatry

## 2017-04-26 ENCOUNTER — Telehealth: Payer: Self-pay

## 2017-04-26 ENCOUNTER — Ambulatory Visit: Payer: PRIVATE HEALTH INSURANCE

## 2017-04-26 MED ORDER — BUPRENORPHINE HCL-NALOXONE HCL 8-2 MG SL FILM *I*
1.0000 | ORAL_FILM | Freq: Every day | SUBLINGUAL | 0 refills | Status: DC
Start: 2017-04-26 — End: 2017-05-08

## 2017-04-26 NOTE — Telephone Encounter (Signed)
Ronald Richard is looking for a PCP and asking if he can become a new patient at [facility].

## 2017-04-26 NOTE — Telephone Encounter (Signed)
Received phone call from patient requesting refill of his suboxone

## 2017-04-30 ENCOUNTER — Ambulatory Visit: Payer: PRIVATE HEALTH INSURANCE | Admitting: Psychiatry

## 2017-04-30 ENCOUNTER — Telehealth: Payer: Self-pay | Admitting: Psychiatry

## 2017-04-30 NOTE — Telephone Encounter (Signed)
PT called to cancel upcoming appointment with Curley SpiceEve Andersenbuescher on 3/14 as pt reports he is unable to leave work that day. Attempted to re-schedule with pt however when next available follow-up appointment was in April pt asked if he could hold off on re-scheduling. Pt requested to leave a voicemail for provider letting them know he was cancelling, Clinical research associatewriter attempted to transfer call to voicemail however provider picked up and call was transferred directly to provider.

## 2017-04-30 NOTE — Telephone Encounter (Signed)
Patient was contacted to setup appointment phone number disconnected  Closing encounter

## 2017-05-01 ENCOUNTER — Telehealth: Payer: Self-pay

## 2017-05-01 ENCOUNTER — Ambulatory Visit: Payer: PRIVATE HEALTH INSURANCE

## 2017-05-01 NOTE — Telephone Encounter (Signed)
Writer received a voicemail from patient who reported he was unable to make it to group yesterday due to having to take his girlfriend to the airport in [place]. He also reported getting stuck at work today and unable to leave. He then reported he was unable to make it next week to group as he is out of town (did not specify for what).     Writer called patient back and left a message requesting a call back to discuss his return date as Clinical research associatewriter seemed unclear and to discuss his attendance as he didn't come to group when Clinical research associatewriter was out of town.

## 2017-05-03 ENCOUNTER — Ambulatory Visit: Payer: PRIVATE HEALTH INSURANCE

## 2017-05-03 ENCOUNTER — Ambulatory Visit: Payer: PRIVATE HEALTH INSURANCE | Admitting: Psychiatry

## 2017-05-07 ENCOUNTER — Ambulatory Visit: Payer: PRIVATE HEALTH INSURANCE

## 2017-05-08 ENCOUNTER — Other Ambulatory Visit: Payer: Self-pay | Admitting: Psychiatry

## 2017-05-08 ENCOUNTER — Ambulatory Visit: Payer: PRIVATE HEALTH INSURANCE

## 2017-05-08 MED ORDER — BUPRENORPHINE HCL-NALOXONE HCL 8-2 MG SL FILM *I*
1.0000 | ORAL_FILM | Freq: Every day | SUBLINGUAL | 0 refills | Status: DC
Start: 2017-05-08 — End: 2017-05-17

## 2017-05-08 NOTE — Telephone Encounter (Signed)
buprenorphine-naloxone (SUBOXONE) 8-2 MG SL film 7 Film 0 04/26/2017     Sig - Route: Place 1 Film under the tongue daily  Max daily dose: 1 Film - Sublingual    Sent to pharmacy as: buprenorphine-naloxone (SUBOXONE) 8-2 MG SL film      [address] - [place], [place] - [id number]

## 2017-05-10 ENCOUNTER — Ambulatory Visit: Payer: PRIVATE HEALTH INSURANCE

## 2017-05-14 ENCOUNTER — Ambulatory Visit: Payer: PRIVATE HEALTH INSURANCE | Attending: Psychiatry

## 2017-05-14 DIAGNOSIS — F419 Anxiety disorder, unspecified: Secondary | ICD-10-CM | POA: Insufficient documentation

## 2017-05-14 DIAGNOSIS — F1020 Alcohol dependence, uncomplicated: Secondary | ICD-10-CM | POA: Insufficient documentation

## 2017-05-14 DIAGNOSIS — F1220 Cannabis dependence, uncomplicated: Secondary | ICD-10-CM | POA: Insufficient documentation

## 2017-05-14 DIAGNOSIS — F1120 Opioid dependence, uncomplicated: Secondary | ICD-10-CM

## 2017-05-15 ENCOUNTER — Ambulatory Visit: Payer: PRIVATE HEALTH INSURANCE

## 2017-05-15 NOTE — Progress Notes (Signed)
[facility] Session Note     PSYCH GROUP THERAPY    Is this a group service?:  Yes    Group Name:  Relapse Prevention  Group ID:  [id number]  Group Face to Face Duration:  60  Number of Participants:  8      Eligibility for previously-prescribed controlled substance:  The patient is NOT currently eligible to have a monthly prescription for previously-prescribed controlled substance. To support safety and adherence to treatment regimen, the following interval is recommended:  One week supply         Overall Focus/Topic   Chemical Dependency  Relapse Prevention    Goal of Group Session  Todays group continued to discuss defense mechanisms      Toxicology Screen: no  Last reported/recorded use: 3/23 - alcohol, marijuana    Patient Participation/Response  Attitude Re Treatment: positive  Provided feedback: yes  Adhered to Group Norms: yes  Expressed Feelings: yes  Maintained Attention: yes  Self-disclosed Relevant Information: yes    Comment  [name] arrived to group on time and was orientated x3. He reported to Clinical research associatewriter he went home to see his family for a week. He also is starting the baseball season coming up this next week, which he is looking forward to. He further participated in the group discussion and how he feels he has a history of anger and lashes out at others.

## 2017-05-16 ENCOUNTER — Inpatient Hospital Stay
Admit: 2017-05-16 | Discharge: 2017-05-17 | Disposition: A | Discharge status: Home / Self Care | Payer: BLUE CROSS/BLUE SHIELD | Attending: Emergency Medicine

## 2017-05-16 DIAGNOSIS — S61511A Laceration without foreign body of right wrist, initial encounter: Secondary | ICD-10-CM

## 2017-05-16 NOTE — ED Notes (Signed)
9:36 PM  05/16/17     Discharge instructions given to pt (name) with verbalization of understanding. Patient accompanied by self.  Patient discharged with the following prescriptions NONE. Patient discharged to home (destination).      Marlowe Kaysonia L Meade, RN

## 2017-05-16 NOTE — ED Provider Notes (Signed)
[facility]  Emergency Department Treatment Report        Patient: Ethan SiasJustin J Fitterer Age: 32 y.o. Sex: male    Date of Birth: 03/19/1985 Admit Date: 05/16/2017 PCP: Kirkland HunGermscheid, Megan, DO   MRN: [id number]  CSN: [id number]  Attending: Candace CruiseHAHN, EMILY A, MD   Room: ER11/ER11 Time Dictated: 8:48 PM WGN:FAOZHAPP:Lashunta Frieden       Chief Complaint   Laceration    History of Present Illness   32 y.o. male states he was cleaning a glass today when it broke and he cut his wrist on the edge of the glass.  Denies any associated numbness of the digits.  No radiation of the pain.  Has some soreness at the site of the laceration.    Review of Systems   Review of Systems   Constitutional: Positive for diaphoresis. Negative for fever.   Skin:        Laceration   Neurological: Negative for tingling.       Past Medical/Surgical History     Past Medical History:   Diagnosis Date   ??? Hematuria    ??? UTI (lower urinary tract infection)      Past Surgical History:   Procedure Laterality Date   ??? HX BUNIONECTOMY      Left Foot       Social History     Social History     Socioeconomic History   ??? Marital status: MARRIED     Spouse name: Not on file   ??? Number of children: Not on file   ??? Years of education: Not on file   ??? Highest education level: Not on file   Occupational History   ??? Not on file   Social Needs   ??? Financial resource strain: Not on file   ??? Food insecurity:     Worry: Not on file     Inability: Not on file   ??? Transportation needs:     Medical: Not on file     Non-medical: Not on file   Tobacco Use   ??? Smoking status: Former Smoker     Packs/day: 0.50     Types: Cigarettes   Substance and Sexual Activity   ??? Alcohol use: Yes     Alcohol/week: 0.0 oz     Comment: socially   ??? Drug use: No   ??? Sexual activity: Not on file   Lifestyle   ??? Physical activity:     Days per week: Not on file     Minutes per session: Not on file   ??? Stress: Not on file   Relationships   ??? Social connections:     Talks on phone: Not on file      Gets together: Not on file     Attends religious service: Not on file     Active member of club or organization: Not on file     Attends meetings of clubs or organizations: Not on file     Relationship status: Not on file   ??? Intimate partner violence:     Fear of current or ex partner: Not on file     Emotionally abused: Not on file     Physically abused: Not on file     Forced sexual activity: Not on file   Other Topics Concern   ??? Not on file   Social History Narrative   ??? Not on file     Last tetanus 3 years ago  Family History     Family History   Problem Relation Age of Onset   ??? Hypertension Mother    ??? Hypertension Father        Current Medications     None       Allergies   No Known Allergies    Physical Exam     ED Triage Vitals   ED Encounter Vitals Group      BP 05/16/17 1954 141/85      Pulse (Heart Rate) 05/16/17 1954 65      Resp Rate 05/16/17 1954 18      Temp 05/16/17 1954 98.3 ??F (36.8 ??C)      Temp src --       O2 Sat (%) 05/16/17 1954 100 %      Weight 05/16/17 1943 178 lb      Height 05/16/17 1943 6'     Physical Exam   Constitutional: He is oriented to person, place, and time.   General appearance: Patient appears well-developed and well-nourished.   Musculoskeletal:   Right wrist- 2.6 cm laceration ulnar aspect of the right wrist through dermis, no tendon involvement noted, base of wound visualized and no foreign body seen   Neurological: He is alert and oriented to person, place, and time.   Skin: Skin is warm and dry.   Vitals reviewed.       Impression and Management Plan   Patient with a laceration to the wrist that will need repair      ED Course   Wound Repair  Date/Time: 05/16/2017 9:04 PM  Preparation: skin prepped with Betadine  Location: Right wrist.  Wound length: 2.6 cm.  Anesthesia: local infiltration    Anesthesia:  Local Anesthetic: lidocaine 1% without epinephrine  Foreign bodies: no foreign bodies  Irrigation solution: saline  Irrigation method: syringe  Debridement: none   Skin closure: 4-0 nylon  Number of sutures: 4.  Technique: simple and horizontal mattress  Approximation: close  Dressing: antibiotic ointment    Coban placed  Medications - No data to display     Medical Decision Making   Clean wound no need for prophylaxis  And up-to-date with tetanus  Final Diagnosis       ICD-10-CM ICD-9-CM   1. Laceration of right wrist, initial encounter S61.511A 881.02       Disposition   Suture removal in 10-12 days  Dicharge home   There are no discharge medications for this patient.      The patient was personally evaluated by myself and discussed with Dr. Luciano Cutter, Lyman Speller, MD who agrees with the above assessment and plan.    Farrel Demark, PA-C  May 16, 2017    My signature above authenticates this document and my orders, the final ??  diagnosis (es), discharge prescription (s), and instructions in the Epic ??  record.  If you have any questions please contact [phone].  ??  Nursing notes have been reviewed by the physician/ advanced practice ??  Clinician.    Dragon medical dictation software was used for portions of this report. Unintended voice recognition errors may occur.

## 2017-05-16 NOTE — ED Notes (Signed)
Cut right hand on glass while doing dishes.

## 2017-05-17 ENCOUNTER — Ambulatory Visit: Payer: PRIVATE HEALTH INSURANCE | Admitting: Psychiatry

## 2017-05-17 ENCOUNTER — Ambulatory Visit: Payer: PRIVATE HEALTH INSURANCE

## 2017-05-17 ENCOUNTER — Other Ambulatory Visit
Admission: RE | Admit: 2017-05-17 | Discharge: 2017-05-17 | Disposition: A | Discharge status: Home / Self Care | Payer: PRIVATE HEALTH INSURANCE | Source: Ambulatory Visit | Attending: Psychiatry | Admitting: Psychiatry

## 2017-05-17 ENCOUNTER — Encounter: Payer: Self-pay | Admitting: Psychiatry

## 2017-05-17 VITALS — BP 131/79 | HR 62 | Wt 270.0 lb

## 2017-05-17 DIAGNOSIS — F1120 Opioid dependence, uncomplicated: Secondary | ICD-10-CM

## 2017-05-17 DIAGNOSIS — F1020 Alcohol dependence, uncomplicated: Secondary | ICD-10-CM

## 2017-05-17 DIAGNOSIS — Z0189 Encounter for other specified special examinations: Secondary | ICD-10-CM | POA: Insufficient documentation

## 2017-05-17 DIAGNOSIS — F1220 Cannabis dependence, uncomplicated: Secondary | ICD-10-CM

## 2017-05-17 DIAGNOSIS — F419 Anxiety disorder, unspecified: Secondary | ICD-10-CM

## 2017-05-17 MED ORDER — ESCITALOPRAM OXALATE 20 MG PO TABS *I*
20.0000 mg | ORAL_TABLET | Freq: Every day | ORAL | 1 refills | Status: DC
Start: 2017-05-17 — End: 2017-06-14

## 2017-05-17 MED ORDER — BUPRENORPHINE HCL-NALOXONE HCL 8-2 MG SL FILM *I*
1.0000 | ORAL_FILM | Freq: Every day | SUBLINGUAL | 0 refills | Status: DC
Start: 2017-05-17 — End: 2017-05-25

## 2017-05-17 NOTE — Progress Notes (Signed)
[facility] PSYCHI[facility]RIC EVALUATION AND MANAGEMENT         HISTORY     Chief Complaint/Reason for Encounter: suboxone follow up    History of Presenting Illness: Patient reports beginning to use opiates at age 32 and reports that ' there is not much that he has not tried' in his past; has had no previous chemical dependency treatment, has struggled with depression and anxiety.  Feels that his main issues are opiates ( oxy) and marijuana, has a h/o binge drinking which he feels he is managing as he has managed to decrease his use over the past year.  Denies any h/o seizures, does report symptoms of withdrawal from alcohol.    On contact today states that he is doing well with the suboxone and feels as if this dose is holding him. Acknowledged use of alcohol and marijuana, denies use of opiates.  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    Past, Family, Social History:  Relocated to [facility] May 2018, working full time as a Comptroller at [employer] , has a girlfriend      Past Psychiatric History:     Bipolar Depression    Psychiatric Hospitalizations: denies     Psychiatric Outpatient: yes    had a Paramedic and psychiatrist in [place] - last saw 2015   Intake at [facility] 12/25/16    CD Treatment:denies    Review of Past Psychiatric Med Trials:    seroquel - helped me sleep   lamictal - not sure if it worked  - was still using other drugs and drinking heavily    Family Psychiatric & Substance Use History:   Maternal Uncle - ' heavy alcoholic'   Youngest brother - marijuana    Review of Systems & Active Medical Problems:  (1 system = Problem Pertinent; 2-9 systems = Extended; 10 or more systems or some systems noted as "all others negative"= Complete)    See pertinent info as in HPI.      Current Medication Side Effects: denies    Last Alcohol & Drug Use:    Alcohol: 05/05/17 - vodka, rum  Nicotine: denies  Cannabis: 05/16/17  Cocaine: 03/17/17  Opiates: 03/25/17 - oxy 5 pm  Synthetic Cannabinoids: denies  Synthetic Stimulants ("Bath Salts"): denies  Non-prescribed Benzodiazepines: 05/2016 - xanax  Mushrooms:2017  Acid/LSD:' twice in my life'  Ecstasy:age 32 - 21 ' very heavily'  Molly:  " used it maybe 5 times in my life  Meth: 2016  Adderall: 2016 - don't like, make me anxious    Longest sobriety: 1 1/2 years  - age 44     Buprenorphine Follow Up:    Cravings: none  Side Effects: none  Toxicology:04/10/17  - (+) THC  Films Remaining: 0    Checked [place] database today. No unexpected prescriptions were found in this database.     PHQ 02/27/2017   PHQ-9 Total Score 6       New Medical Hx: denies        PSYCHIATRIC  EXAMINATION   (1-5 bullets = Problem Focused; 6-8 bullets = Expanded Problem Focused; At least 9 bullets = Detailed; Completion of All bulleted items - Comprehensive). Note: Vital Signs are weighted as one system area)  BP 131/79    Pulse 62    Wt 122.5 kg (270 lb)    BMI 36.62 kg/m     Appearance:  Casual  Manner:  Slightly defensive  Musculoskeletal:  WNL -  Speech:  Normal rate, Normal volume, southern accent  Thought Process:  Normal  Description of Associations:  Goal Directed, Logical   Description of Abnormal or Psychotic Thoughts (Includes Safety Risk):  Thought Content: No unusual themes  Perception: No evidence of hallucinations   Safety Risk: Suicidal Ideation: Patient Denies, Homicidal Ideation: Patient Denies  Description of Judgment and Insight:  Intact judgment  Good insight  Orientation:  Alert and Oriented X 3.  Memory (Recent/Remote):  intact  Attention/Concentration:  WNL  Language:  No evidence of impairment in language, comprehension, or expression noted on today's exam.  Fund of Knowledge:  Intact  Mood and Affect:  Normal mood  Appropriate affect    ASSESSMENT OF RISK  FOR SUICIDAL BEHAVIOR  Changes in risk for suicide from baseline Formulation of Risk and/or previous intake, including newly identified risk, if any: none    ASSESSMENT OF RISK FOR VIOLENT BEHAVIOR  Changes in risk for violence from baseline Formulation of Risk and/or previous intake, including newly identified risk, if any: none      MEDICAL DECISION MAKING     Diagnoses:    ICD-10-CM ICD-9-CM   1. Opioid use disorder, severe, dependence F11.20 304.00   2. Cannabis use disorder, moderate, dependence F12.20 304.30   3. Alcohol use disorder, severe, dependence F10.20 303.90   4. Anxiety F41.9 300.00       Formulation:  Ronald Richard is a 32 y.o. Caucasian male with polysubstance use disorder, most notably opiates, cannabis and alcohol, who presents today for follow up after suboxone induction 03/27/17.  States that he feels current suboxone dose is working well, no cravings for opiates but continues with alcohol and marijuana and not willing to stop either of these at this time as does not feel these are a problem for him,  Taking colace consistently and finding it helpful now that he is doing that.  Continues with some anxiety and agreeable to increasing his lexapro. Defensive regarding clinic attendance and need to attend groups - to work with counselor to address this and need to put his recovery first - particularly as he is adamant that he will not attend clinic more than once a week, prefers groups to 1:1 sessions as 'they cost $150) - inconsistent in his rationalizing, initially stating this was an on-call position that he had no control over, later saying if we could agree to once per week he could put a block on Tuesdays so he did not get called but was unwilling to do more than that.   Denies SI/HI/AH/VH, denies any safety concerns.        Discussed with counselor ( who attended part of session today to discuss attendance issues) that patient may need a collaborative care session to address his commitment to  treatment and whether or not [facility] is a good fit for him given his poor attendance, unwillingness to attend more than one meeting/group per week and his ambivalence regarding addressing his alcohol and marijuana use.      Medications/Medical Records/Labs/Diagnostic Tests Reviewed:  Current Outpatient Prescriptions   Medication Sig    escitalopram (LEXAPRO) 20 MG tablet Take 1 tablet (20 mg total) by mouth daily    buprenorphine-naloxone (SUBOXONE) 8-2 MG SL film Place 1 Film under the  tongue daily   Max daily dose: 1 Film    cloNIDine (CATAPRES) 0.1 MG tablet Take 1 tablet (0.1 mg total) by mouth 2 times daily    docusate sodium (COLACE) 100 MG capsule Take 1 capsule (100 mg total) by mouth 2 times daily       Counseling/Coordination of Care:  [x]  Diagnostic results/impressions and/or recommended studies were discussed   [x]  Instruction for management/treatment/followup discussed  [x]  Risks and benefits of treatment options discussed  [x]  Education provided to patient/family/caregiver  []  Side effects and benefits of his medications were discussed  []  Side effect management discussed  [x]  Adjustments of medication timing discussed  []  Risk factor reduction discussed  []  Prognosis discussed  [x]  Importance of adherence to treatment regimen discussed  []  Nicotine dependence and quitting were discussed.  []  Relapse prevention discussed    Plan:  Suboxone:  8 mg daily SL - weekly scripts  Increase:  lexapro 20 mg daily  continue:  Clonidine 0.1 mg twice a day                   Colace 100 mg twice  Day  Follow up with PCP re: urinary frequency - states called [facility] and was supposed to get a call back and did not, instructed to call them again  Purchase and start;  NAC 1200 mg twice a day    Return visit: 21 days at 12 pm     Length of Visit:  25 minutes  Greater than 50 % of face to face time was spent in counseling/coordination of care

## 2017-05-18 LAB — CHEM DEPEND + ETOH SCRN, URINE
Amphetamine,UR: NEGATIVE
Benzodiazepinen,UR: NEGATIVE
Cocaine/Metab,UR: NEGATIVE
Ethanol,UR: NEGATIVE
Opiates,UR: NEGATIVE
Oxycodone/Oxymorphone,UR: NEGATIVE
THC Metabolite,UR: POSITIVE

## 2017-05-18 LAB — THC SEMI-QUANT, URINE
Semi-Quant THC,UR: 2850 ng/mL
THC/Creat Ratio: 1278 ng/mg

## 2017-05-18 LAB — ETHYL GLUCURONIDE, UR: Ethyl glucuronide, Ur: NEGATIVE

## 2017-05-18 NOTE — Progress Notes (Signed)
[facility] Session Note     PSYCH GROUP THERAPY    Is this a group service?:  Yes    Group Name:  [facility]  Group ID:  [id number]  Group Face to Face Duration:  75  Number of Participants:  5      Eligibility for previously-prescribed controlled substance:  The patient is NOT currently eligible to have a monthly prescription for previously-prescribed controlled substance. To support safety and adherence to treatment regimen, the following interval is recommended:  One week supply         Overall Focus/Topic   Chemical Dependency  Relapse Prevention    Goal of Group Session  Todays group discussed opening up to others about their addiction history and MAT. The group discussed this in the context of intimate relationships, family members, and employment.       Toxicology Screen: no  Last reported/recorded use: 3/27 - marijuana    Patient Participation/Response  Attitude Re Treatment: positive  Provided feedback: yes  Adhered to Group Norms: yes  Expressed Feelings: yes  Maintained Attention: yes  Self-disclosed Relevant Information: yes    Comment  Ronald Richard arrived to group on time and was orientated x3. He reported having possibly baseball this weekend but isn't sure yet. He is hoping with his girlfriend this summer to do some camping as they have been buying the materials. He further participated in the group discussion and opened up about his girlfriend knowing he is in treatment, but not on Suboxone. He expressed concerns about his reaction and fears she would leave him. He further discussed not wanting to tell his father out of fear he will be disappointed in him.

## 2017-05-21 ENCOUNTER — Ambulatory Visit: Payer: PRIVATE HEALTH INSURANCE | Attending: Psychiatry

## 2017-05-21 DIAGNOSIS — F419 Anxiety disorder, unspecified: Secondary | ICD-10-CM | POA: Insufficient documentation

## 2017-05-21 DIAGNOSIS — F1020 Alcohol dependence, uncomplicated: Secondary | ICD-10-CM | POA: Insufficient documentation

## 2017-05-21 DIAGNOSIS — F1120 Opioid dependence, uncomplicated: Secondary | ICD-10-CM

## 2017-05-21 DIAGNOSIS — F1220 Cannabis dependence, uncomplicated: Secondary | ICD-10-CM | POA: Insufficient documentation

## 2017-05-22 ENCOUNTER — Ambulatory Visit: Payer: PRIVATE HEALTH INSURANCE

## 2017-05-22 LAB — BUPRENORPHINE, UR QNT
BUPREN GLUC,UR QNT: 302 ng/mL
BUPRENORPHINE,UR QNT: 7 ng/mL
Creatinine,UR: 223 mg/dL (ref 20–300)
NORBUPREN GLUC,UR QNT: 368 ng/mL
NORBUPRENORPHINE,UR QNT: 66 ng/mL
TOTAL BUP/CREAT, UR QNT: 101 ng/mg
TOTAL BUPREN,UR QNT: 226 ng/mL
TOTAL NORBUP/CREAT,UR QNT: 145 ng/mg
TOTAL NORBUPREN,UR QNT: 324 ng/mL

## 2017-05-22 NOTE — Progress Notes (Signed)
[facility] Session Note     PSYCH GROUP THERAPY    Is this a group service?:  Yes    Group Name:  Relapse Prevention  Group ID:  [id number]  Group Face to Face Duration:  60  Number of Participants:  8      Eligibility for previously-prescribed controlled substance:  The patient is NOT currently eligible to have a monthly prescription for previously-prescribed controlled substance. To support safety and adherence to treatment regimen, the following interval is recommended:  One week supply         Overall Focus/Topic   Chemical Dependency  Relapse Prevention    Goal of Group Session  Today's group discussed strenghs. They answered questions about positive aspects and strenghts about themselves and how this could be benefical for their recovery.       Toxicology Screen: no  Last reported/recorded use: 3/31 - marijuana    Patient Participation/Response  Attitude Re Treatment: positive  Provided feedback: yes  Adhered to Group Norms: yes  Expressed Feelings: yes  Maintained Attention: yes  Self-disclosed Relevant Information: yes    Comment  [name] arrived to group on time and was orientated x3. He reported having off this weekend and was able to spend time with his girlfriend before she started her job as a traveling Engineer, civil (consulting)nurse. He further participated in the group discussion and was able to identify his strengths.

## 2017-05-24 ENCOUNTER — Ambulatory Visit: Payer: PRIVATE HEALTH INSURANCE

## 2017-05-25 ENCOUNTER — Other Ambulatory Visit: Payer: Self-pay | Admitting: Psychiatry

## 2017-05-25 ENCOUNTER — Ambulatory Visit: Payer: PRIVATE HEALTH INSURANCE

## 2017-05-25 DIAGNOSIS — F1220 Cannabis dependence, uncomplicated: Secondary | ICD-10-CM

## 2017-05-25 MED ORDER — BUPRENORPHINE HCL-NALOXONE HCL 8-2 MG SL FILM *I*
1.0000 | ORAL_FILM | Freq: Every day | SUBLINGUAL | 0 refills | Status: DC
Start: 2017-05-25 — End: 2017-06-07

## 2017-05-27 ENCOUNTER — Inpatient Hospital Stay
Admit: 2017-05-27 | Discharge: 2017-05-27 | Disposition: A | Discharge status: Home / Self Care | Payer: BLUE CROSS/BLUE SHIELD

## 2017-05-27 NOTE — ED Notes (Signed)
4 suture (s) were removed by  Tech without difficulty.  Wound edges were well approximated.  Steri-strips were not used.  There was not evidence of infection.  Patient did tolerate well.

## 2017-05-28 ENCOUNTER — Ambulatory Visit: Payer: PRIVATE HEALTH INSURANCE

## 2017-05-28 NOTE — Progress Notes (Signed)
[facility] Individual Progress Note     Duration:  30 minutes      Contact Type:  Location: On Site    Face to Face     Eligibility for previously-prescribed controlled substance:  The patient is NOT currently eligible to have a monthly prescription for previously-prescribed controlled substance. To support safety and adherence to treatment regimen, the following interval is recommended:  One week supply       Last Toxicology Screen Date: N/A     Last reported/recorded use: 05/24/17 - marijuana      Treatment/Recovery Goals Addressed:    Treatment Problem #1 04/11/2017   1ST TREATMENT PLAN PROBLEM Keedan has struggles to abstain from use of substances, which has created negative consequences in his life.     Treatment Goal #1 04/11/2017   1ST TREATMENT PLAN GOAL Orlanda will abstain from all substances and develop a lifestyle of recovery.     Treatment Problem #2 04/11/2017   2ND TREATMENT PLAN PROBLEM Hensley has a history of mental health and is concerned about the impact it has had on his use and relationships.     Treatment Goal #2 04/11/2017   2ND TREATMENT PLAN GOAL Braxen will develop an awareness of his mental health and the develop skills to cope without using substances.      Treatment Problem #3 04/11/2017   3RD TREATMENT PLAN PROBLEM Sephiroth has limited family support and has yet to tell his girlfriend about his addiction and treatment.     Treatment Goal #3 04/11/2017   3RD TREATMENT PLAN GOAL Athan will further engage his girlfriend and family in his recovery.        Stress/Use/Mood/Risk Evaluation:    Stress     How have you been feeling?  "Good"      Whats better since I last saw you?  "I have been busy with games"     Has anything happened to increase your stress since the last time we met?  No         Would you like to talk about any of those things today?  N/A     On a scale of 10, with 10 as highest, how would you rate your current stress level? N/A     Use     Have you relapsed since we last  met and what were the circumstances surrounding the relapse?  Yes marijuana      Have you used any AOD at all in the last week?  marijuana      Have you increased your use due to stressors since we last met?  No       Mood     Are you feeling depressed or anxious?  No     Any trouble with disturbing or negative thoughts?  No         Risk (suicidal or homicidal)     Any thoughts of harming yourself or others?  Self Injurious Behavior: Patient Denies, Suicidal Ideation: Patient Denies, Homicidal Ideation: Patient Denies, Aggressive Behavior: Patient Denies      Domestic Violence Screen:  No indication to update domestic violence screen.    Session Content  Treatment plan developed with patient  Treatment plan reviewed with patient  Patient is in agreement with treatment plan   Dequarius declined a copy of his treatment plan    Evidence-based or Clinical Intervention Used:  Cognitive Behavioral Therapy  Motivational Interviewing  Family Counseling    Session summary/response to session:  Neng arrived to his session on time and as orientated x3. He reported being busy at work this week but overall has found work to be going well. He alos updated Probation officer on baseball season, which just started. He reported having more games then planned but is enjoying it so far. Writer and patient discussed his baseball schedule and concerns about him being able to attend group. Writer reviewed with him the expectations of attending as he is enrolled in a treatment program and not just receiving Suboxone. Writer explained the differences between treatment programs and separate providers. He did ask questions about how to look them up, but did feel he still needed treatment. Writer and patient discussed him requesting specific days off to be able to stay in group. He reported his schedule will calm down more after high school season is done and will be able to come three times a week again. Writer discussed a plan of him staying in  early recovery but he needs to maintain his attendance. Writer also reminded him to maintain communication as he didn't when he decided to go to [place] to see family.    He updated Probation officer about his family and relationship. He had a good time seeing his mother but still has yet to tell his father. He has no plans at this time to do so. He further reported his girlfriend left to do traveling nurse in [place] for a while. He feels he will be busy with baseball either way so he won't be bothered by her being gone.    Writer reviewed his treatment overall and expectations going forward.     Plan  Change Treatment/Recovery plan to: Term from [facility]. He will continue in ER and be closely monitored for attendance.    Current Treatment Plan   Created/Updated On 04/11/2017   Next Treatment Plan Due 06/17/2017       (Please complete the Domestic Abuse and Neglect screening question on the Screening Navigator during treatment plan review session)

## 2017-05-29 ENCOUNTER — Ambulatory Visit: Payer: PRIVATE HEALTH INSURANCE

## 2017-05-29 ENCOUNTER — Other Ambulatory Visit: Payer: Self-pay | Admitting: Psychiatry

## 2017-05-31 ENCOUNTER — Ambulatory Visit: Payer: PRIVATE HEALTH INSURANCE

## 2017-06-04 ENCOUNTER — Ambulatory Visit: Payer: PRIVATE HEALTH INSURANCE

## 2017-06-05 ENCOUNTER — Ambulatory Visit: Payer: PRIVATE HEALTH INSURANCE

## 2017-06-05 DIAGNOSIS — F1220 Cannabis dependence, uncomplicated: Secondary | ICD-10-CM

## 2017-06-06 NOTE — Progress Notes (Signed)
[facility] Session Note     PSYCH GROUP THERAPY    Is this a group service?:  Yes    Group Name:  [facility]  Group ID:  [id number]  Group Face to Face Duration:  90  Number of Participants:  6      Eligibility for previously-prescribed controlled substance:  The patient is NOT currently eligible to have a monthly prescription for previously-prescribed controlled substance. To support safety and adherence to treatment regimen, the following interval is recommended:  One week supply         Overall Focus/Topic   Chemical Dependency  Relapse Prevention    Goal of Group Session  Today's group discussed 12 ingredients necessary for living your best life. They discussed the first six and how they feel their use impacts those areas.     Toxicology Screen: no  Last reported/recorded use: 4/15 - marijuana    Patient Participation/Response  Attitude Re Treatment: positive  Provided feedback: yes  Adhered to Group Norms: yes  Expressed Feelings: yes  Maintained Attention: yes  Self-disclosed Relevant Information: yes    Comment  [name] arrived to group on time and was orientated x3. He reported having off this week from baseball and has spent the time looking for an apartment as him and his girlfriend need to move in the next few months. When asked about his marijuana use, he reported using daily the last few weeks. Writer commented to him that this is an increase from when he was reporting a few times a week the first few months. He reported this may be due to his girlfriend being out of town for her employment and having freedom to do it at home. Writer reminded him about the importance of abstaining and expressed concern about this increase. He further participated in the group discussion in which he discussed the impact his use has had on his freedom and the difference he can tell with now not having to worry about getting it, using, then being caught.

## 2017-06-07 ENCOUNTER — Other Ambulatory Visit: Payer: Self-pay | Admitting: Psychiatry

## 2017-06-07 ENCOUNTER — Ambulatory Visit: Payer: PRIVATE HEALTH INSURANCE

## 2017-06-07 MED ORDER — BUPRENORPHINE HCL-NALOXONE HCL 8-2 MG SL FILM *I*
1.0000 | ORAL_FILM | Freq: Every day | SUBLINGUAL | 0 refills | Status: DC
Start: 2017-06-07 — End: 2017-06-14

## 2017-06-07 NOTE — Telephone Encounter (Signed)
Request acknowledged by technician, sent to nursing pool for follow up.     buprenorphine-naloxone (SUBOXONE) 8-2 MG SL film 7 Film 0 05/25/2017     Sig - Route: Place 1 Film under the tongue daily  Max daily dose: 1 Film - Sublingual    Sent to pharmacy as: buprenorphine-naloxone (SUBOXONE) 8-2 MG SL film      [facility] [place], [place] - [phone]

## 2017-06-11 ENCOUNTER — Ambulatory Visit: Payer: PRIVATE HEALTH INSURANCE

## 2017-06-12 ENCOUNTER — Ambulatory Visit: Payer: PRIVATE HEALTH INSURANCE

## 2017-06-13 DIAGNOSIS — F1020 Alcohol dependence, uncomplicated: Secondary | ICD-10-CM

## 2017-06-13 DIAGNOSIS — F1220 Cannabis dependence, uncomplicated: Secondary | ICD-10-CM

## 2017-06-13 DIAGNOSIS — F419 Anxiety disorder, unspecified: Secondary | ICD-10-CM

## 2017-06-13 DIAGNOSIS — Z639 Problem related to primary support group, unspecified: Secondary | ICD-10-CM

## 2017-06-13 DIAGNOSIS — F1120 Opioid dependence, uncomplicated: Secondary | ICD-10-CM

## 2017-06-13 NOTE — BH Treatment Plan (Signed)
[facility] Person-Centered Treatment Plan     Date of Plan:   [facility]:   Created/Updated On 06/13/2017   FROM 06/17/2017      Created/Updated On 06/13/2017   TO 09/14/2017       Service Location: [facility]     Diagnostic Impression    ICD-10-CM ICD-9-CM   1. Opioid use disorder, severe, dependence F11.20 304.00   2. Cannabis use disorder, moderate, dependence F12.20 304.30   3. Alcohol use disorder, severe, dependence F10.20 303.90   4. Anxiety F41.9 300.00   5. Relationship problems Z63.9 313.3       Patient Strengths and Limitations:   Social/Recreational:  Deferred   Alvon lacks local supports and is wanting to engage in community resources. This will be addressed further into his treatment.  Family:  Limitation   Athol's family is not local and his girlfriend is unaware of his addiciton.   Physical Health:  N/A  Spiritual:  N/A  Gender Identity:  N/A  Sexual Orientation:  N/A  Education:  Strength  Employment:  Strength  Full time employment  Housing:  Sales executive housing  Community Integration:  N/A  Cultural/Language:  N/A  Outreach Services:  N/A  Other:  N/A  Additional Items:  Mental/Emotional:  Active  Substance Use:  Active  Legal:  N/A  Problem Gambling:  N/A      Domestic Violence:   Patient and/or identification tool has not identified the presence of domestic violence at this time.  ________________________________________________________________    Substance Use Treatment Goals:  Author: Dellia Nims, MA    MEDICATION SUPPORTED RECOVERY:  Opioid Replacement Therapy: Buprenorphine    COLLABORATIVE CARE SESSION:yes    DATE OF LAST PHYSICAL EXAM:  Not Applicable    Treatment Problem #1 04/11/2017   1ST TREATMENT PLAN PROBLEM Tiberius has struggles to abstain from use of substances, which has created negative consequences in his life.       Treatment Goal #1 04/11/2017   1ST TREATMENT PLAN GOAL Billyjoe will abstain from all substances and develop a lifestyle of recovery.       The rationale for  addressing this problem is that resolving it will (select all that apply):  Reduce symptoms of disorder, Reduce functional impairment associated with disorder, Facilitate transfer skills learned in therapy to everday life and Is a key motivational factor for the patient's participation in treatment      Progress toward goal(s): Carmine has been struggling with his attendence, which needed to be addressed. He has developed a plan with Clinical research associate but still is inconsistent. He also increased in his marijuana use. Writer will continue to review treatment expectations to be able to continue as well as receive MAT    1 a. Measurable Objectives : Romie would like to "be sober" as evidenced by obtaining and maintaining abstinence from all mood altering substances, as discussed in individual and group sessions.               Date established: 06/17/17              Target date: By Treatment Plan Review Date              Intervention(s): Staff will discuss progress in individual and group sessions, and identify barriers in recovery.    1 b. Measurable Objectives : Deron would like to "get help by being on Suboxone" as evidenced by attending his appointments with Eve Andersen-Buescher and taking medication as prescribed.  Date established: 06/17/17   Target date: By Treatment Plan Review Date   Intervention(s): Counselor will monitor his attendance and address any barriers if there are any to occur.         Treatment Problem #2 04/11/2017   2ND TREATMENT PLAN PROBLEM [name] has a history of mental health and is concerned about the impact it has had on his use and relationships.       Treatment Goal #2 04/11/2017   2ND TREATMENT PLAN GOAL [name] will develop an awareness of his mental health and the develop skills to cope without using substances.        The rationale for addressing this problem is that resolving it will (select all that apply):  Facilitate transfer skills learned in therapy to everday life and Is a key motivational  factor for the patient's participation in treatment    Progress toward goal(s): Writer has been unable to assess his anxiety and depression fully due to his attendance issues. He also increased his marijuana use which has made it difficult to fully understand his anxiety. Writer will further address concerns with him about him self-medicating with marijuana.    2 a. Measurable Objectives : [name] is interested in "staying positive to not get depressed or angry" as evidenced by identifying his mental health symptoms and verbalizing more constructive means of coping.               Date established: 06/17/17              Target date: By Treatment Plan Review Date              Intervention(s): Counselor will educate [name] about symptoms of depression and anger to help her gain an understanding of her own and further process healthier, more constructive means of coping.     2 b. Measurable Objectives : [name] would like to get further evaluated as evidenced by attending her appointments with the psychiatrist and taking medications as prescribed.    Date established: 06/17/17   Target date: By Treatment Plan Review Date   Intervention(s): Counselor will refer writer to the psychiatrist and follow up with any barriers to her appointments. Writer will encourage her to take medications as prescribed.    ..........................................................................................................................................    Treatment Problem #3 04/11/2017   3RD TREATMENT PLAN PROBLEM [name] has limited family support and has yet to tell his girlfriend about his addiction and treatment.       Treatment Goal #3 04/11/2017   3RD TREATMENT PLAN GOAL [name] will further engage his girlfriend and family in his recovery.        The rationale for addressing this problem is that resolving it will (select all that apply):  Facilitate transfer skills learned in therapy to everday life    Progress toward goal(s): No  change. Comment: [name] continues to report he has yet to disclose to his girlfriend. Writer will continue to process his hesitation and discuss the benefits of him starting to include her.    3 a. Measurable Objectives : [name] will understand his hesitation to tell his girlfirned as evidenced by identifying his feelings and thoughts about including her in about his addiction struggles, as well as the impact this could have on his goal of recovery.    Date established: 06/17/17   Target date: By Treatment Plan Review Date   Intervention(s): Counselor will assist him in exploring his feelings about not disclosing to his girlfriend his struggle with addiction and  encourage him to include her in his recovery.  _____________________________________________________________________    Plan  TREATMENT MODALITIES:  Individual psychotherapy for 45 min Q 3 weeks with Dellia Nims, MA.  Buprenorphine prescribed by Billy Fischer prescription frequency Q once a  weeks frequency of visits Q once a  weeks   [facility] group psychotherapy for 90 min Q twice a week with Dellia Nims, MA  Primary Care screening yearly.    [facility] PRIMARY CARE SCREEN 03/05/2017   [facility] Primary Care Screen Date 03/05/2017       No flowsheet data found.    Patient was provided with written material on the prevention and treatment of communicable diseases, including viral hepatitis, sexually transmitted diseases and HIV/AIDS,  which includes counseling and education on the use of condoms, testing ,and pre- and post-exposure prophylaxis and treatment.                 Is the patient a veteran? No    DISCHARGE CRITERIA for this treatment setting: N/A    Has the Safety Plan been created/reviewed for this review period? Yes     Coordination of Care:   PCP: Provider, None  Care Management: N/A  Type of provider(s): N/A  Provider Info (Agency name, contact person/phone number) and Frequency of Contact (weekly, monthly, quarterly, etc.):  N/A    Discussion with patient if there is a desire to change advanced directive.  No    Emergency Plan has been reviewed.    Patient/Family Statement  PATIENT/FAMILY STATEMENT:  Obtain patient and family input into the treatment plan, including areas of agreement / disagreement.  Obtain patient's signature - if not possible, briefly describe the reason.     Patient Comments:  None    I HAVE PARTICIPATED IN THE DEVELOPMENT OF THIS TREATMENT PLAN AND I AGREE WITH ITS CONTENTS:       Patient Signature: _______________________________________________________    Date: ______________________________

## 2017-06-13 NOTE — BH Treatment Plan (Signed)
Reviewed by Renardo Cheatum A Josearmando Kuhnert, RN

## 2017-06-14 ENCOUNTER — Ambulatory Visit: Payer: PRIVATE HEALTH INSURANCE | Admitting: Psychiatry

## 2017-06-14 ENCOUNTER — Ambulatory Visit: Payer: PRIVATE HEALTH INSURANCE

## 2017-06-14 VITALS — BP 133/71 | HR 66 | Ht 72.0 in | Wt 275.0 lb

## 2017-06-14 DIAGNOSIS — F1220 Cannabis dependence, uncomplicated: Secondary | ICD-10-CM

## 2017-06-14 DIAGNOSIS — F419 Anxiety disorder, unspecified: Secondary | ICD-10-CM

## 2017-06-14 DIAGNOSIS — F1120 Opioid dependence, uncomplicated: Secondary | ICD-10-CM

## 2017-06-14 MED ORDER — VILAZODONE HCL 10 MG PO TABS *I*
ORAL_TABLET | ORAL | 1 refills | Status: DC
Start: 2017-06-14 — End: 2017-07-10

## 2017-06-14 MED ORDER — BUPRENORPHINE HCL-NALOXONE HCL 8-2 MG SL FILM *I*
1.0000 | ORAL_FILM | Freq: Every day | SUBLINGUAL | 0 refills | Status: DC
Start: 2017-06-14 — End: 2017-06-28

## 2017-06-14 NOTE — Progress Notes (Signed)
[facility] PSYCHI[facility]RIC EVALUATION AND MANAGEMENT         HISTORY     Chief Complaint/Reason for Encounter: suboxone follow up, depressed mood    History of Presenting Illness: Patient reports beginning to use opiates at age 32 and reports that ' there is not much that he has not tried' in his past; has had no previous chemical dependency treatment, has struggled with depression and anxiety.  Feels that his main issues are opiates ( oxy) and marijuana, has a h/o binge drinking which he feels he is managing as he has managed to decrease his use over the past year.  Denies any h/o seizures, does report symptoms of withdrawal from alcohol.    On contact today states that he is doing well with the suboxone and feels as if this dose is holding him. Acknowledged use of marijuana, denies use of opiates.  Reports that he is feeling increasingly sedated with lexapro - spent all last weekend in bed other than using the bathroom; also feels it is affecting his sex drive and is still sweating.   Does feel that his mood is better and has been given (+) feedback from his girlfriend regarding this.       Past, Family, Social History:  Relocated to [facility] May 2018, working full time as a Comptroller at [employer] , has a girlfriend      Past Psychiatric History:     Bipolar Depression    Psychiatric Hospitalizations: denies     Psychiatric Outpatient: yes    had a therapist and psychiatrist in [place] - last saw 2015   Intake at [facility] 12/25/16    CD Treatment:denies    Review of Past Psychiatric Med Trials:    seroquel - helped me sleep   lamictal - not sure if it worked  - was still using other drugs and drinking heavily    Family Psychiatric & Substance Use History:   Maternal Uncle - ' heavy alcoholic'   Youngest brother - marijuana    Review of Systems & Active Medical Problems:  (1 system = Problem Pertinent; 2-9 systems = Extended; 10 or more systems or some systems  noted as "all others negative"= Complete)    See pertinent info as in HPI.     Current Medication Side Effects: denies    Last Alcohol & Drug Use:    Alcohol: 05/05/17 - vodka, rum  Nicotine: denies  Cannabis: 06/14/17  Cocaine: 03/17/17  Opiates: 03/25/17 - oxy 5 pm  Synthetic Cannabinoids: denies  Synthetic Stimulants ("Bath Salts"): denies  Non-prescribed Benzodiazepines: 05/2016 - xanax  Mushrooms:2017  Acid/LSD:' twice in my life'  Ecstasy:age 39 - 21 ' very heavily'  Molly:  " used it maybe 5 times in my life  Meth: 2016  Adderall: 2016 - don't like, make me anxious    Longest sobriety: 1 1/2 years  - age 60     Buprenorphine Follow Up:    Cravings: none  Side Effects: none  Toxicology:05/17/17  - (+) THC  Films Remaining: 0    Checked [place] database today. No unexpected prescriptions were found in this database.     PHQ 02/27/2017   PHQ-9 Total Score 6       New Medical Hx: denies        PSYCHIATRIC  EXAMINATION   (1-5 bullets = Problem Focused; 6-8 bullets = Expanded Problem Focused; At least 9 bullets = Detailed; Completion of All bulleted  items - Comprehensive). Note: Vital Signs are weighted as one system area)    BP 133/71    Pulse 66    Ht 1.829 m (6')    Wt 124.7 kg (275 lb)    BMI 37.30 kg/m     Appearance:  Casual  Manner:  Slightly defensive  Musculoskeletal:  WNL -  Speech:  Normal rate, Normal volume, southern accent  Thought Process:  Normal  Description of Associations:  Goal Directed, Logical   Description of Abnormal or Psychotic Thoughts (Includes Safety Risk):  Thought Content: No unusual themes  Perception: No evidence of hallucinations   Safety Risk: Suicidal Ideation: Patient Denies, Homicidal Ideation: Patient Denies  Description of Judgment and Insight:  Intact judgment  Good insight  Orientation:  Alert and Oriented X 3.  Memory (Recent/Remote):  intact  Attention/Concentration:  WNL  Language:  No evidence of impairment in language, comprehension, or expression noted on today's exam.  Fund  of Knowledge:  Intact  Mood and Affect:  Normal mood  Appropriate affect    ASSESSMENT OF RISK FOR SUICIDAL BEHAVIOR  Changes in risk for suicide from baseline Formulation of Risk and/or previous intake, including newly identified risk, if any: none    ASSESSMENT OF RISK FOR VIOLENT BEHAVIOR  Changes in risk for violence from baseline Formulation of Risk and/or previous intake, including newly identified risk, if any: none      MEDICAL DECISION MAKING     Diagnoses:    ICD-10-CM ICD-9-CM   1. Opioid use disorder, severe, dependence F11.20 304.00   2. Cannabis use disorder, moderate, dependence F12.20 304.30   3. Anxiety F41.9 300.00       Formulation:  Ronald KenningJustin Richard is a 32 y.o. Caucasian male with polysubstance use disorder, most notably opiates, cannabis and alcohol, who presents today for follow up after suboxone induction 03/27/17.  States that he feels current suboxone dose is working well, has no cravings for opiates but continues using marijuana and ambivalent about stopping this at this point in time - able to state that he needs to address this but has not moved forward on this, including starting the NAC.  Having some concerns regarding lexapro which he felt worsened when dosage was increased.  Discussed options moving forward and he is agreeable to transitioning to viibryd as this is noted to have less sexual side effects - at present time will decrease his lexapro over the next two weeks while starting the viibryd.  Denies SI/HI/AH/VH, denies any safety concerns.     Medications/Medical Records/Labs/Diagnostic Tests Reviewed:  Current Outpatient Prescriptions   Medication Sig    vilazodone (VIIBRYD) 10 MG tablet Take 1 tab daily for 14 days, then increase to 2 tabs daily    buprenorphine-naloxone (SUBOXONE) 8-2 MG SL film Place 1 Film under the tongue daily   Max daily dose: 1 Film    cloNIDine (CATAPRES) 0.1 MG tablet TAKE 1 TABLET (0.1MG  TOTAL) BY MOUTH TWO TIMES A DAY    docusate sodium (COLACE) 100  MG capsule Take 1 capsule (100 mg total) by mouth 2 times daily       Counseling/Coordination of Care:  [x]  Diagnostic results/impressions and/or recommended studies were discussed   [x]  Instruction for management/treatment/followup discussed  [x]  Risks and benefits of treatment options discussed  [x]  Education provided to patient/family/caregiver  []  Side effects and benefits of his medications were discussed  []  Side effect management discussed  [x]  Adjustments of medication timing discussed  []  Risk factor reduction  discussed  []  Prognosis discussed  [x]  Importance of adherence to treatment regimen discussed  []  Nicotine dependence and quitting were discussed.  []  Relapse prevention discussed    Plan:  Suboxone:  8 mg daily SL - weekly scripts  Taper:  lexapro as discussed  continue:  Clonidine 0.1 mg twice a day                   Colace 100 mg twice  Day  Start: viibryd 10 mg daily for two weeks then increase to 20 mg daily  Purchase and start;  NAC 1200 mg twice a day    Return visit: 4 weeks   Duration:  20 minutes    Length of Visit:  20 minutes  Greater than 50 % of face to face time was spent in counseling/coordination of care

## 2017-06-15 ENCOUNTER — Ambulatory Visit: Payer: PRIVATE HEALTH INSURANCE

## 2017-06-15 DIAGNOSIS — F1120 Opioid dependence, uncomplicated: Secondary | ICD-10-CM

## 2017-06-18 ENCOUNTER — Ambulatory Visit: Payer: PRIVATE HEALTH INSURANCE

## 2017-06-18 NOTE — Progress Notes (Signed)
[facility] Individual Progress Note     Duration:  30 minutes      Contact Type:  Location: On Site    Face to Face     Eligibility for previously-prescribed controlled substance:  The patient is NOT currently eligible to have a monthly prescription for previously-prescribed controlled substance. To support safety and adherence to treatment regimen, the following interval is recommended:  One week supply       Last Toxicology Screen Date: 05/17/17     Last reported/recorded use: 06/14/17 - marijuana      Treatment/Recovery Goals Addressed:    Treatment Problem #1 04/11/2017   1ST TREATMENT PLAN PROBLEM Ronald Richard has struggles to abstain from use of substances, which has created negative consequences in his life.     Treatment Goal #1 04/11/2017   1ST TREATMENT PLAN GOAL Ronald Richard will abstain from all substances and develop a lifestyle of recovery.     Treatment Problem #2 04/11/2017   2ND TREATMENT PLAN PROBLEM Ronald Richard has a history of mental health and is concerned about the impact it has had on his use and relationships.     Treatment Goal #2 04/11/2017   2ND TREATMENT PLAN GOAL Ronald Richard will develop an awareness of his mental health and the develop skills to cope without using substances.      Treatment Problem #3 04/11/2017   3RD TREATMENT PLAN PROBLEM Ronald Richard has limited family support and has yet to tell his girlfriend about his addiction and treatment.     Treatment Goal #3 04/11/2017   3RD TREATMENT PLAN GOAL Ronald Richard will further engage his girlfriend and family in his recovery.        Stress/Use/Mood/Risk Evaluation:    Stress     How have you been feeling?  "tired lately"     Whats better since I last saw you?  "I think I'm going to tell my girlfriend"     Has anything happened to increase your stress since the last time we met?  No         Would you like to talk about any of those things today?  N/A     On a scale of 10, with 10 as highest, how would you rate your current stress level? N/A     Use     Have you  relapsed since we last met and what were the circumstances surrounding the relapse?  Yes marijuana      Have you used any AOD at all in the last week?  marijuana      Have you increased your use due to stressors since we last met?  No       Mood     Are you feeling depressed or anxious?  No     Any trouble with disturbing or negative thoughts?  No         Risk (suicidal or homicidal)     Any thoughts of harming yourself or others?  Self Injurious Behavior: Patient Denies, Suicidal Ideation: Patient Denies, Homicidal Ideation: Patient Denies, Aggressive Behavior: Patient Denies      Domestic Violence Screen:  No indication to update domestic violence screen.    Session Content  Treatment plan developed with patient  Treatment plan reviewed with patient  Patient is in agreement with treatment plan   Ronald Richard declined a copy of his treatment plan    Evidence-based or Clinical Intervention Used:  Cognitive Behavioral Therapy  Motivational Interviewing  Family Counseling    Session summary/response  to session:   Ronald Richard arrived to his session on time and as orientated x3. He reported being tired this week as he got sick Tuesday and Wednesday and is now behind at work. 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 His schedule will then ease up as he will have limited baseball events.     Ronald Richard reported to Probation officer he is considering telling his girlfriend today he is on Suboxone. He reported getting tired of "hiding it from it". He discussed his plan to tell her that the last two visits he had this week he discussed with it with the provider and felt it would be helpful for his cravings. He reported "maybe once a month still having them" but is able to work through them. However, it has made him realize he should tell her. Being she is a Marine scientist and familiar with Suboxone, he wants to be honest with her about discussing the cravings with her, especially since he  has been more sore lately from baseball. She is aware of his past of opiates but does not know about him using since he came to [place]. Writer encouraged him to discuss this with her to help engage her in his recovery, especially given he has increased on his marijuana use since he started. He is contributing this to her being gone, but Probation officer did correlate to him that he could be substituting out. He did report plans to get NAC that was discussed with him to try that. He denied any alcohol use in the last few weeks. Writer encouraged him to follow through and Secondary school teacher on Monday if he needs additional support after talking to her.     Plan  Continue Treatment as planned.    Current Treatment Plan   Created/Updated On 06/13/2017   Next Treatment Plan Due 09/14/2017       (Please complete the Domestic Abuse and Neglect screening question on the Screening Navigator during treatment plan review session)

## 2017-06-19 ENCOUNTER — Ambulatory Visit: Payer: PRIVATE HEALTH INSURANCE

## 2017-06-19 DIAGNOSIS — F1120 Opioid dependence, uncomplicated: Secondary | ICD-10-CM

## 2017-06-20 NOTE — Progress Notes (Signed)
[facility] Group Session Note     PSYCH GROUP THERAPY    Is this a group service?:  Yes    Group Name:  [facility]  Group ID:  [id number]  Group Face to Face Duration:  90  Number of Participants:  8      Eligibility for previously-prescribed controlled substance:  The patient is NOT currently eligible to have a monthly prescription for previously-prescribed controlled substance. To support safety and adherence to treatment regimen, the following interval is recommended:  One week supply         Overall Focus/Topic   Chemical Dependency  Relapse Prevention    Goal of Group Session  Todays group discussed relapses and how to process them. Some group members discussed their recent relapses and were provided support by other group members.     Toxicology Screen: no  Last reported/recorded use: 4/29 - marijuana    Patient Participation/Response  Attitude Re Treatment: positive  Provided feedback: yes  Adhered to Group Norms: yes  Expressed Feelings: yes  Maintained Attention: yes  Self-disclosed Relevant Information: yes    Comment  Elnathan arrived to group on time and was orientated x3. He reported telling his girlfriend about getting Suboxone, which he reported she was conflicted about but was dealing. She then smelled marijuana on him the next day and became upset with him as she is concerned about him using and not telling her. He discussed how they are processing it as a couple, but also how he feels a weight has been lifted since he told her. He was given group support regarding this and provided positive feedback about taking the step in telling his girlfriend.

## 2017-06-21 ENCOUNTER — Ambulatory Visit: Payer: PRIVATE HEALTH INSURANCE

## 2017-06-25 ENCOUNTER — Ambulatory Visit: Payer: PRIVATE HEALTH INSURANCE

## 2017-06-26 ENCOUNTER — Ambulatory Visit: Payer: PRIVATE HEALTH INSURANCE | Attending: Psychiatry

## 2017-06-26 DIAGNOSIS — F1220 Cannabis dependence, uncomplicated: Secondary | ICD-10-CM | POA: Insufficient documentation

## 2017-06-26 DIAGNOSIS — F419 Anxiety disorder, unspecified: Secondary | ICD-10-CM | POA: Insufficient documentation

## 2017-06-26 DIAGNOSIS — F329 Major depressive disorder, single episode, unspecified: Secondary | ICD-10-CM | POA: Insufficient documentation

## 2017-06-26 DIAGNOSIS — F1020 Alcohol dependence, uncomplicated: Secondary | ICD-10-CM | POA: Insufficient documentation

## 2017-06-26 DIAGNOSIS — F1120 Opioid dependence, uncomplicated: Secondary | ICD-10-CM | POA: Insufficient documentation

## 2017-06-27 NOTE — Progress Notes (Addendum)
[facility] Session Note     PSYCH GROUP THERAPY    Is this a group service?:  Yes    Group Name:  [facility]  Group ID:  [id number]  Group Face to Face Duration:  90  Number of Participants:  6      Eligibility for previously-prescribed controlled substance:  The patient is NOT currently eligible to have a monthly prescription for previously-prescribed controlled substance. To support safety and adherence to treatment regimen, the following interval is recommended:  One week supply         Overall Focus/Topic   Chemical Dependency  Relapse Prevention    Goal of Group Session  Todays group participated in the "empty chair"  activity. The group identified who they "envisioned" their drug of choice to be as a person and described they. They identified what they felt their drug of choice would say to them. They further discussed what they would say to them.     Toxicology Screen: no  Last reported/recorded use: 06/25/17 - marijuana    Patient Participation/Response  Attitude Re Treatment: positive  Provided feedback: yes  Adhered to Group Norms: yes  Expressed Feelings: yes  Maintained Attention: yes  Self-disclosed Relevant Information: yes    Comment  Ronald Richard arrived to group on time and was orientated x3. He reported he was stressed today due to being overwhelmed with both jobs and treatment. He reported getting little sleep and ended up sleeping all weekend. He reported his positive is he is persistent. He further participated in the group discussion and reported finding the image of the person to be "disgusting, I don't even want to look at them". He reported wanting to tell them to leave him alone and go away.

## 2017-06-28 ENCOUNTER — Ambulatory Visit: Payer: PRIVATE HEALTH INSURANCE

## 2017-06-28 ENCOUNTER — Other Ambulatory Visit: Payer: Self-pay | Admitting: Psychiatry

## 2017-06-28 MED ORDER — BUPRENORPHINE HCL-NALOXONE HCL 8-2 MG SL FILM *I*
1.0000 | ORAL_FILM | Freq: Every day | SUBLINGUAL | 0 refills | Status: DC
Start: 2017-06-28 — End: 2017-07-09

## 2017-06-28 NOTE — Telephone Encounter (Signed)
Request acknowledged by technician, sent to nursing pool for follow up.     buprenorphine-naloxone (SUBOXONE) 8-2 MG SL film, Place 1 Film under the tongue daily   Max daily dose: 1 Film     [facility] - [place],  - [address] [phone] (Phone)  [phone] (Fax)

## 2017-07-03 ENCOUNTER — Ambulatory Visit: Payer: PRIVATE HEALTH INSURANCE

## 2017-07-05 ENCOUNTER — Ambulatory Visit: Payer: PRIVATE HEALTH INSURANCE

## 2017-07-06 ENCOUNTER — Other Ambulatory Visit
Admission: RE | Admit: 2017-07-06 | Discharge: 2017-07-06 | Disposition: A | Discharge status: Home / Self Care | Payer: PRIVATE HEALTH INSURANCE | Source: Ambulatory Visit | Attending: Psychiatry | Admitting: Psychiatry

## 2017-07-06 ENCOUNTER — Ambulatory Visit: Payer: PRIVATE HEALTH INSURANCE

## 2017-07-06 DIAGNOSIS — F1120 Opioid dependence, uncomplicated: Secondary | ICD-10-CM

## 2017-07-06 DIAGNOSIS — Z0189 Encounter for other specified special examinations: Secondary | ICD-10-CM | POA: Insufficient documentation

## 2017-07-06 LAB — CHEM DEPEND + ETOH SCRN, URINE
Amphetamine,UR: NEGATIVE
Benzodiazepinen,UR: NEGATIVE
Cocaine/Metab,UR: NEGATIVE
Ethanol,UR: NEGATIVE
Opiates,UR: NEGATIVE
Oxycodone/Oxymorphone,UR: NEGATIVE
THC Metabolite,UR: POSITIVE

## 2017-07-06 LAB — ETHYL GLUCURONIDE, UR: Ethyl glucuronide, Ur: NEGATIVE

## 2017-07-09 ENCOUNTER — Other Ambulatory Visit: Payer: Self-pay | Admitting: Psychiatry

## 2017-07-09 LAB — THC SEMI-QUANT, URINE
Semi-Quant THC,UR: 1250 ng/mL
THC/Creat Ratio: 796 ng/mg

## 2017-07-09 MED ORDER — BUPRENORPHINE HCL-NALOXONE HCL 8-2 MG SL FILM *I*
1.0000 | ORAL_FILM | Freq: Every day | SUBLINGUAL | 0 refills | Status: DC
Start: 2017-07-09 — End: 2017-07-23

## 2017-07-09 NOTE — Telephone Encounter (Signed)
Request acknowledged by technician, sent to nursing pool for follow up.     buprenorphine-naloxone (SUBOXONE) 8-2 MG SL film 7 Film 0 06/28/2017     Sig - Route: Place 1 Film under the tongue daily  Max daily dose: 1 Film - Sublingual    Sent to pharmacy as: buprenorphine-naloxone (SUBOXONE) 8-2 MG SL film      [address] - [place], [place] - [address]

## 2017-07-09 NOTE — Progress Notes (Signed)
[facility] Progress Note     Duration:  30 minutes      Contact Type:  Location: On Site    Face to Face     Eligibility for previously-prescribed controlled substance:  The patient is NOT currently eligible to have a monthly prescription for previously-prescribed controlled substance. To support safety and adherence to treatment regimen, the following interval is recommended:  One week supply       Last Toxicology Screen Date: 07/06/17     Last reported/recorded use: 07/06/17 - marijuana      Treatment/Recovery Goals Addressed:    Treatment Problem #1 04/11/2017   1ST TREATMENT PLAN PROBLEM Ronald Richard has struggles to abstain from use of substances, which has created negative consequences in his life.     Treatment Goal #1 04/11/2017   1ST TREATMENT PLAN GOAL Ronald Richard will abstain from all substances and develop a lifestyle of recovery.     Treatment Problem #2 04/11/2017   2ND TREATMENT PLAN PROBLEM Ronald Richard has a history of mental health and is concerned about the impact it has had on his use and relationships.     Treatment Goal #2 04/11/2017   2ND TREATMENT PLAN GOAL Ronald Richard will develop an awareness of his mental health and the develop skills to cope without using substances.      Treatment Problem #3 04/11/2017   3RD TREATMENT PLAN PROBLEM Ronald Richard has limited family support and has yet to tell his girlfriend about his addiction and treatment.     Treatment Goal #3 04/11/2017   3RD TREATMENT PLAN GOAL Ronald Richard will further engage his girlfriend and family in his recovery.        Stress/Use/Mood/Risk Evaluation:    Stress     How have you been feeling?  "Sore from baseball"     Whats better since I last saw you?  "I'm not sure."     Has anything happened to increase your stress since the last time we met?  Yes how are you handling those things?  struggles to identify as he has been smoking marijuana lately to cope         Would you like to talk about any of those things today?  N/A     On a scale of 10, with  10 as highest, how would you rate your current stress level? N/A     Use     Have you relapsed since we last met and what were the circumstances surrounding the relapse?  Yes marijuana      Have you used any AOD at all in the last week?  marijuana      Have you increased your use due to stressors since we last met?  No       Mood     Are you feeling depressed or anxious?  No     Any trouble with disturbing or negative thoughts?  No         Risk (suicidal or homicidal)     Any thoughts of harming yourself or others?  Self Injurious Behavior: Patient Denies, Suicidal Ideation: Patient Denies, Homicidal Ideation: Patient Denies, Aggressive Behavior: Patient Denies      Domestic Violence Screen:  No indication to update domestic violence screen.    Session Content  Treatment plan developed with patient  Treatment plan reviewed with patient  Patient is in agreement with treatment plan   Ronald Richard declined a copy of his treatment plan    Evidence-based or Clinical Intervention Used:  Cognitive Behavioral Therapy  Motivational Interviewing  Family Counseling    Session summary/response to session:   Ronald Richard arrived to his session on time and as orientated x3. He reported being sore the last few days from his knees. He also reported getting food poisoning earlier in the week and had to leave work on Tuesday and miss Wednesday. Due to this, he is feeling overwhelmed by his projects at work.     He updated Probation officer on how relationship, which he reported she is coming home for a month in July then has decided to go out of state for nursing for another three months. She plans on going to either [place] or [place], which they have discussed a goal of seeing each other at least once a month. He feels comfortable with this due to their relationship being long distance for 9 months when they were first dating. His girlfriend is still update with him for smoking marijuana though and is unaware he is smoking daily.    Writer and  patient discussed him decreasing from marijuana, which he reported finding it harder then he anticipated. He discussed how he smokes in the morning before going to work then will smoke when he gets home, unless he can before baseball. Writer and patient discussed how to decrease his use, in which he reported trying to decrease and be abstaining by the time his girlfriend comes home for the month in July. Writer encouraged him to follow through with this identified goal, especially given she will be gone for 3 more months after that and he will have the freedom to smoke like he does now.    He further discussed with writer stress with his brother, who is in [place] with a girlfriend. He is worried his brother is being taken advantage of. The mother of his girlfriend has health issues and he is having to take care of her. He discussed how he always has taken care of him and "I get very angry with his situation". His girlfriend struggles to understand why he is upset and doesn't feel he has anyone to talk to about it. Writer and patient discussed how to cope with those emotions, especially given he can't change this situation. He identified how he is providing support to his brother and let him know he can come stay with him, which his brother has declined at this moment. writer encouraged him to continue to provide that support.     Plan  Continue Treatment as planned.    Current Treatment Plan   Created/Updated On 06/13/2017   Next Treatment Plan Due 09/14/2017       (Please complete the Domestic Abuse and Neglect screening question on the Screening Navigator during treatment plan review session)

## 2017-07-10 ENCOUNTER — Ambulatory Visit: Payer: PRIVATE HEALTH INSURANCE | Admitting: Psychiatry

## 2017-07-10 ENCOUNTER — Ambulatory Visit: Payer: PRIVATE HEALTH INSURANCE

## 2017-07-10 VITALS — BP 149/76 | HR 51 | Ht 72.0 in | Wt 276.0 lb

## 2017-07-10 DIAGNOSIS — F1120 Opioid dependence, uncomplicated: Secondary | ICD-10-CM

## 2017-07-10 DIAGNOSIS — F32A Depression, unspecified: Secondary | ICD-10-CM

## 2017-07-10 DIAGNOSIS — F1220 Cannabis dependence, uncomplicated: Secondary | ICD-10-CM

## 2017-07-10 DIAGNOSIS — F419 Anxiety disorder, unspecified: Secondary | ICD-10-CM

## 2017-07-10 MED ORDER — SERTRALINE HCL 50 MG PO TABS *I*
50.0000 mg | ORAL_TABLET | Freq: Every day | ORAL | 1 refills | Status: DC
Start: 2017-07-10 — End: 2017-07-26

## 2017-07-10 NOTE — Progress Notes (Signed)
[facility]AL[facility] MANAGEMENT         HISTORY     Chief Complaint/Reason for Encounter: suboxone follow up, depressed mood    History of Presenting Illness: Patient reports beginning to use opiates at age 32 and reports that ' there is not much that he has not tried' in his past; has had no previous chemical dependency treatment, has struggled with depression and anxiety.  Feels that his main issues are opiates ( oxy) and marijuana, has a h/o binge drinking which he feels he is managing as he has managed to decrease his use over the past year.  Denies any h/o seizures, does report symptoms of withdrawal from alcohol.    On contact today states that he is doing well with the suboxone and feels as if this dose is holding him. Acknowledged use of marijuana - last use 3 days ago, denies use of opiates. Tapered self off lexapro - unable to pick up/start viibryd due to cost  - now feeling as if mood is down, states that his GF states he was happier on lexapro but feels it also made him tired, more irritable, doesn't want to have to take something to feel good.  Wondering if he feels down because he is stressed at work or every day life.States GF found out he was smoking mariajuana ' and it did not go down well'  - was supposed to stop when she found out 10 days ago but was unable to stop until 3 days ago.  Did not pick up or start NAC.      Past, Family, Social History:  Relocated to [facility] May 2018, working full time as a Comptroller at [employer] , has a girlfriend      Past Psychiatric History:     Bipolar Depression    Psychiatric Hospitalizations: denies     Psychiatric Outpatient: yes    had a therapist and psychiatrist in [place] - last saw 2015   Intake at [facility] 12/25/16    CD Treatment:denies    Review of Past Psychiatric Med Trials:    seroquel - helped me sleep   lamictal - not sure if it worked  - was still using other drugs and  drinking heavily    Family Psychiatric & Substance Use History:   Maternal Uncle - ' heavy alcoholic'   Youngest brother - marijuana    Review of Systems & Active Medical Problems:  (1 system = Problem Pertinent; 2-9 systems = Extended; 10 or more systems or some systems noted as "all others negative"= Complete)    See pertinent info as in HPI.     Current Medication Side Effects: denies    Last Alcohol & Drug Use:    Alcohol: 05/05/17 - vodka, rum  Nicotine: denies  Cannabis: 07/07/17  Cocaine: 03/17/17  Opiates: 03/25/17 - oxy 5 pm  Synthetic Cannabinoids: denies  Synthetic Stimulants ("Bath Salts"): denies  Non-prescribed Benzodiazepines: 05/2016 - xanax  Mushrooms:2017  Acid/LSD:' twice in my life'  Ecstasy:age 32 - 21 ' very heavily'  Molly:  " used it maybe 5 times in my life  Meth: 2016  Adderall: 2016 - don't like, make me anxious    Longest sobriety: 1 1/2 years  - age 32     Buprenorphine Follow Up:    Cravings: none  Side Effects: none  Toxicology:07/06/17  - (+) THC  Films Remaining: 0    Checked [place] database today.  No unexpected prescriptions were found in this database.     PHQ 02/27/2017   PHQ-9 Total Score 6       New Medical Hx: denies        PSYCHIATRIC  EXAMINATION   (1-5 bullets = Problem Focused; 6-8 bullets = Expanded Problem Focused; At least 9 bullets = Detailed; Completion of All bulleted items - Comprehensive). Note: Vital Signs are weighted as one system area)    BP 149/76    Pulse 51    Ht 1.829 m (6')    Wt 125.2 kg (276 lb)    BMI 37.43 kg/m     Appearance:  Casual  Manner:  Cooperative, tearful  Musculoskeletal:  WNL -  Speech:  Normal rate, Normal volume, southern accent  Thought Process:  Normal  Description of Associations:  Goal Directed, Logical   Description of Abnormal or Psychotic Thoughts (Includes Safety Risk):  Thought Content: No unusual themes  Perception: No evidence of hallucinations   Safety Risk: Suicidal Ideation: Patient Denies, Homicidal Ideation: Patient  Denies  Description of Judgment and Insight:  Intact judgment  Good insight  Orientation:  Alert and Oriented X 3.  Memory (Recent/Remote):  intact  Attention/Concentration:  WNL  Language:  No evidence of impairment in language, comprehension, or expression noted on today's exam.  Fund of Knowledge:  Intact  Mood and Affect  Depressed mood  sad affect    ASSESSMENT OF RISK FOR SUICIDAL BEHAVIOR  Changes in risk for suicide from baseline Formulation of Risk and/or previous intake, including newly identified risk, if any: none    ASSESSMENT OF RISK FOR VIOLENT BEHAVIOR  Changes in risk for violence from baseline Formulation of Risk and/or previous intake, including newly identified risk, if any: none      MEDICAL DECISION MAKING     Diagnoses:    ICD-10-CM ICD-9-CM   1. Opioid use disorder, severe, dependence F11.20 304.00   2. Cannabis use disorder, moderate, dependence F12.20 304.30   3. Anxiety F41.9 300.00   4. Depression, unspecified depression type F32.9 311       Formulation:  Ronald Richard is a 32 y.o. Caucasian male with polysubstance use disorder, most notably opiates, cannabis and alcohol, who presents today for follow up after suboxone induction 03/27/17.  States that he feels current suboxone dose is working well, has no cravings for opiates, had continued to use marijuana until 3 days ago but is now trying to stop - reports that his GF has found out about his suboxone as well as his marijuana and is not happy about either - has not been completely forthcoming about his opioid use history and minimized the amount of marijuana he has been using.  Has also weaned himself off lexapro - unable to start viibryd due to excessive copay - tearful today for most of session, reports that he is struggling with mood and feeling overwhelmed at times with work and his girlfriend and trying to keep up with things - acknowledges that he felt better when on lexapro but struggling to accept that he needs a medication to  feel better; reflected back that he had been using a substance to address his anxiety and depression- marijuana, and he acknowledged that this was true and that he had been functioning by being ' basically high all the time' - processed with him what he was going through currently, including marijuana withdrawal which is a real thing.  Has not started NAC which we discussed as a way  of addressing some of the withdrawal he was feeling.  Also spent time discussing various SSRI's to address depression and anxiety - concerned about the sexual side effects he had experienced with Lexapro but also realizing that he needs something  - agreeable to initiating sertraline at 50 mg daily  - discussed benefits versus side effects.  Denies SI/HI/AH/VH.     Medications/Medical Records/Labs/Diagnostic Tests Reviewed:  Current Outpatient Prescriptions   Medication Sig    sertraline (ZOLOFT) 50 MG tablet Take 1 tablet (50 mg total) by mouth daily    buprenorphine-naloxone (SUBOXONE) 8-2 MG SL film Place 1 Film under the tongue daily   Max daily dose: 1 Film    cloNIDine (CATAPRES) 0.1 MG tablet TAKE 1 TABLET (0.1MG  TOTAL) BY MOUTH TWO TIMES A DAY    docusate sodium (COLACE) 100 MG capsule Take 1 capsule (100 mg total) by mouth 2 times daily       Counseling/Coordination of Care:   Diagnostic results/impressions and/or recommended studies were discussed    Instruction for management/treatment/followup discussed   Risks and benefits of treatment options discussed   Education provided to patient/family/caregiver   Side effects and benefits of his medications were discussed   Side effect management discussed   Adjustments of medication timing discussed   Risk factor reduction discussed   Prognosis discussed   Importance of adherence to treatment regimen discussed   Nicotine dependence and quitting were discussed.   Relapse prevention discussed    Plan:  Suboxone:  8 mg daily SL - weekly  scripts  continue:  Clonidine 0.1 mg twice a day                   Colace 100 mg twice  Day  Purchase and start;  NAC 1200 mg twice a day    Return visit: 4 weeks   Duration:  20 minutes    Length of Visit:  20 minutes  Greater than 50 % of face to face time was spent in counseling/coordination of care

## 2017-07-11 LAB — BUPRENORPHINE, UR QNT
BUPREN GLUC,UR QNT: 339 ng/mL
BUPRENORPHINE,UR QNT: 15 ng/mL
Creatinine,UR: 157 mg/dL (ref 20–300)
NORBUPREN GLUC,UR QNT: 219 ng/mL
NORBUPRENORPHINE,UR QNT: 59 ng/mL
TOTAL BUP/CREAT, UR QNT: 166 ng/mg
TOTAL BUPREN,UR QNT: 261 ng/mL
TOTAL NORBUP/CREAT,UR QNT: 136 ng/mg
TOTAL NORBUPREN,UR QNT: 213 ng/mL

## 2017-07-11 NOTE — Progress Notes (Signed)
[facility] Session Note     PSYCH GROUP THERAPY    Is this a group service?:  Yes    Group Name:  [facility]  Group ID:  [id number]  Group Face to Face Duration:  90  Number of Participants:  6      Eligibility for previously-prescribed controlled substance:  The patient is NOT currently eligible to have a monthly prescription for previously-prescribed controlled substance. To support safety and adherence to treatment regimen, the following interval is recommended:  One week supply         Overall Focus/Topic   Chemical Dependency  Relapse Prevention    Goal of Group Session  Todays group discussed triggers and stressors related to employment. The group further discussed how to cope with anxiety and triggers when at work and trying to balance out treatment and employment.     Toxicology Screen: no  Last reported/recorded use: 07/08/17 - marijuana    Patient Participation/Response  Attitude Re Treatment: positive  Provided feedback: yes  Adhered to Group Norms: yes  Expressed Feelings: yes  Maintained Attention: yes  Self-disclosed Relevant Information: yes    Comment  Trevyon arrived to group on time and was orientated x3. He reported he didn't smoke yesterday and had trouble sleeping.Marland Kitchen He also has found himself to be more "irritable" but is working through it today. He further discussed having baseball games this weekend but they are going to start slowing down after this week. He further participated and discussed his struggle to break the habits of using during the day lately as he is smoking before work and during it with his vape pen. He was encouraged to explore different options that were discussed in group to ease the anxiety and trigger to use.

## 2017-07-12 ENCOUNTER — Ambulatory Visit: Payer: PRIVATE HEALTH INSURANCE | Admitting: Psychiatry

## 2017-07-12 ENCOUNTER — Ambulatory Visit: Payer: PRIVATE HEALTH INSURANCE

## 2017-07-17 ENCOUNTER — Ambulatory Visit: Payer: PRIVATE HEALTH INSURANCE

## 2017-07-17 DIAGNOSIS — F1120 Opioid dependence, uncomplicated: Secondary | ICD-10-CM

## 2017-07-18 NOTE — Progress Notes (Signed)
[facility] Session Note     PSYCH GROUP THERAPY    Is this a group service?:  Yes    Group Name:  [facility]  Group ID:  [id number]  Group Face to Face Duration:  90  Number of Participants:  6      Eligibility for previously-prescribed controlled substance:  The patient is NOT currently eligible to have a monthly prescription for previously-prescribed controlled substance. To support safety and adherence to treatment regimen, the following interval is recommended:  One week supply         Overall Focus/Topic   Chemical Dependency  Relapse Prevention    Goal of Group Session   Todays group participated in a group activity in which they identified what represented a struggle they are having right now. They picked a post card that represented that and discussed it. They further identified a card that they felt represented another group member and provided positive feedback.     Toxicology Screen: no  Last reported/recorded use: 07/08/17 - marijuana; 5/28 - opiates    Patient Participation/Response  Attitude Re Treatment: positive  Provided feedback: yes  Adhered to Group Norms: yes  Expressed Feelings: yes  Maintained Attention: yes  Self-disclosed Relevant Information: yes    Comment  Saban arrived to group on time and was orientated x3. He reported he hasn't used any marijuana but used opiates over the weekend and did this morning. He reported struggling without marijuana and the balance of not using anything. He reported being overwhelmed with work and baseball but is hoping to get a good balance now that baseball for high school is ending. He further participated in the group discussion and how he is struggling to find peace with himself and his addiction. He was provided group support regarding it.

## 2017-07-19 ENCOUNTER — Ambulatory Visit: Payer: PRIVATE HEALTH INSURANCE

## 2017-07-23 ENCOUNTER — Other Ambulatory Visit: Payer: Self-pay | Admitting: Psychiatry

## 2017-07-23 MED ORDER — BUPRENORPHINE HCL-NALOXONE HCL 8-2 MG SL FILM *I*
1.0000 | ORAL_FILM | Freq: Every day | SUBLINGUAL | 0 refills | Status: DC
Start: 2017-07-23 — End: 2017-08-01

## 2017-07-23 NOTE — Telephone Encounter (Signed)
buprenorphine-naloxone (SUBOXONE) 8-2 MG SL film 7 Film 0 06/28/2017     Sig - Route: Place 1 Film under the tongue daily  Max daily dose: 1 Film - Sublingual    Sent to pharmacy as: buprenorphine-naloxone (SUBOXONE) 8-2 MG SL film      [address] [facility] [facility] [place], [place] - [id number]      Electronically signed by Francena HanlyWarren, Brent J at 07/09/2017 1:14 PM

## 2017-07-24 ENCOUNTER — Ambulatory Visit: Payer: PRIVATE HEALTH INSURANCE

## 2017-07-26 ENCOUNTER — Ambulatory Visit: Payer: PRIVATE HEALTH INSURANCE | Attending: Psychiatry

## 2017-07-26 ENCOUNTER — Ambulatory Visit: Payer: PRIVATE HEALTH INSURANCE | Admitting: Psychiatry

## 2017-07-26 VITALS — BP 143/78 | HR 56 | Wt 265.0 lb

## 2017-07-26 DIAGNOSIS — F1120 Opioid dependence, uncomplicated: Secondary | ICD-10-CM | POA: Insufficient documentation

## 2017-07-26 DIAGNOSIS — F419 Anxiety disorder, unspecified: Secondary | ICD-10-CM

## 2017-07-26 DIAGNOSIS — F1020 Alcohol dependence, uncomplicated: Secondary | ICD-10-CM | POA: Insufficient documentation

## 2017-07-26 DIAGNOSIS — F1220 Cannabis dependence, uncomplicated: Secondary | ICD-10-CM

## 2017-07-26 DIAGNOSIS — F329 Major depressive disorder, single episode, unspecified: Secondary | ICD-10-CM | POA: Insufficient documentation

## 2017-07-26 DIAGNOSIS — F32A Depression, unspecified: Secondary | ICD-10-CM

## 2017-07-26 MED ORDER — SERTRALINE HCL 100 MG PO TABS *I*
100.0000 mg | ORAL_TABLET | Freq: Every day | ORAL | 1 refills | Status: DC
Start: 2017-07-26 — End: 2017-09-06

## 2017-07-26 NOTE — Progress Notes (Addendum)
[facility] PSYCHIATRIC EVALUATION AND MANAGEMENT         HISTORY     Chief Complaint/Reason for Encounter: suboxone follow up, depressed mood    History of Presenting Illness: Patient reports beginning to use opiates at age 32 and reports that ' there is not much that he has not tried' in his past; has had no previous chemical dependency treatment, has struggled with depression and anxiety.  Feels that his main issues are opiates ( oxy) and marijuana, has a h/o binge drinking which he feels he is managing as he has managed to decrease his use over the past year.  Denies any h/o seizures, does report symptoms of withdrawal from alcohol.    On contact today states that he is doing better than last time he saw Clinical research associatewriter - has not used any marijuana but has taken opiates twice - once two weeks ago - May 20th and then again 6/4 - percocet, feels that the suboxone is working, it is just that there are times he wants to be high - feels that life is easier that way. Purchased NAC and just started taking, currently taking one capsule each morning.        Past, Family, Social History:  [place], working full time as a Comptrollermechanical engineer at [employer] , has a girlfriend      Past Psychiatric History:     Bipolar Depression    Psychiatric Hospitalizations: denies     Psychiatric Outpatient: yes    [place] therapist [place] in [place] - last saw 2015   [facility]ake [facility]/25/[facility]    CD Treatment:denies    Review of Past Psychiatric Med Trials:    seroquel - helped me sleep   lamictal - not sure if it worked  - was still using other drugs and drinking heavily    Family Psychiatric & Substance Use History:   Maternal Uncle - ' heavy alcoholic'   Youngest brother - marijuana    Review of Systems & Active Medical Problems:  (1 system = Problem Pertinent; 2-9 systems = Extended; 10 or more systems or some systems noted as "all others negative"= Complete)    See  pertinent info as in HPI.     Current Medication Side Effects: denies    Last Alcohol & Drug Use:    Alcohol: 05/05/17 - vodka, rum  Nicotine: denies  Cannabis: 07/07/17  Cocaine: 03/17/17  Opiates: 07/24/17 -  Synthetic Cannabinoids: denies  Synthetic Stimulants ("Bath Salts"): denies  Non-prescribed Benzodiazepines: 05/2016 - xanax  Mushrooms:2017  Acid/LSD:' twice in my life'  Ecstasy:age 83 - 21 ' very heavily'  Molly:  " used it maybe 5 times in my life  Meth: 2016  Adderall: 2016 - don't like, make me anxious    Longest sobriety: 1 1/2 years  - age 32     Buprenorphine Follow Up:    Cravings: none  Side Effects: none  Toxicology:07/06/17  - (+) THC  Films Remaining: 1    Checked [place] database today. No unexpected prescriptions were found in this database.     PHQ 02/27/2017   PHQ-9 Total Score 6       New Medical Hx: denies        PSYCHIATRIC  EXAMINATION   (1-5 bullets = Problem Focused; 6-8 bullets = Expanded Problem Focused; At least 9 bullets = Detailed; Completion of All bulleted items - Comprehensive). Note: Vital Signs are  weighted as one system area)    BP 143/78    Pulse 56    Wt 120.2 kg (265 lb)    BMI 35.94 kg/m     Appearance:  Casual  Manner:  Cooperative  Musculoskeletal:  WNL -  Speech:  Normal rate, Normal volume, southern accent  Thought Process:  Normal  Description of Associations:  Goal Directed, Logical   Description of Abnormal or Psychotic Thoughts (Includes Safety Risk):  Thought Content: No unusual themes  Perception: No evidence of hallucinations   Safety Risk: Suicidal Ideation: Patient Denies, Homicidal Ideation: Patient Denies  Description of Judgment and Insight:  Intact judgment  Good insight  Orientation:  Alert and Oriented X 3.  Memory (Recent/Remote):  intact  Attention/Concentration:  WNL  Language:  No evidence of impairment in language, comprehension, or expression noted on today's exam.  Fund of Knowledge:  Intact  Mood and Affect  Neutral  mood  appropriate  affect    ASSESSMENT OF RISK FOR SUICIDAL BEHAVIOR  Changes in risk for suicide from baseline Formulation of Risk and/or previous intake, including newly identified risk, if any: none    ASSESSMENT OF RISK FOR VIOLENT BEHAVIOR  Changes in risk for violence from baseline Formulation of Risk and/or previous intake, including newly identified risk, if any: none      MEDICAL DECISION MAKING     Diagnoses:    ICD-10-CM ICD-9-CM   1. Opioid use disorder, severe, dependence F11.20 304.00   2. Cannabis use disorder, moderate, dependence F12.20 304.30   3. Anxiety F41.9 300.00   4. Depression, unspecified depression type F32.9 311       Formulation:  Ronald Richard is a 32 y.o. Caucasian male with polysubstance use disorder, most notably opiates, cannabis and alcohol, who presents today for follow up after suboxone induction 03/27/17.  States that he feels current suboxone dose is working well, has no cravings for opiates despite using twice, has not had any marijuana and feels that he is getting through the withdrawal from this - purchased NAC a week ago and taking one capsule daily - discussed dosing with him and he has agreed to increase this.  No issues with tolerating sertraline, not having any sexual side effects - agreeable to increasing this to 100 mg daily.  Spent time discussing mental versus physical aspects of addiction and using skills to deal with triggers as well as adjusting to tolerating life stressors without any substances on board.  Denies SI/HI/AH/VH, denies any safety concerns.     Medications/Medical Records/Labs/Diagnostic Tests Reviewed:  Current Outpatient Prescriptions   Medication Sig    sertraline (ZOLOFT) 100 MG tablet Take 1 tablet (100 mg total) by mouth daily    buprenorphine-naloxone (SUBOXONE) 8-2 MG SL film Place 1 Film under the tongue daily   Max daily dose: 1 Film    cloNIDine (CATAPRES) 0.1 MG tablet TAKE 1 TABLET (0.1MG  TOTAL) BY MOUTH TWO TIMES A DAY    docusate sodium (COLACE) 100  MG capsule Take 1 capsule (100 mg total) by mouth 2 times daily       Counseling/Coordination of Care:  [x]  Diagnostic results/impressions and/or recommended studies were discussed   [x]  Instruction for management/treatment/followup discussed  [x]  Risks and benefits of treatment options discussed  [x]  Education provided to patient/family/caregiver  []  Side effects and benefits of his medications were discussed  []  Side effect management discussed  [x]  Adjustments of medication timing discussed  []  Risk factor reduction discussed  []  Prognosis discussed  [  x] Importance of adherence to treatment regimen discussed  []  Nicotine dependence and quitting were discussed.  []  Relapse prevention discussed    Plan:  Suboxone:  8 mg daily SL - weekly scripts  continue:  Clonidine 0.1 mg twice a day                   Colace 100 mg twice  Day  Increase: sertraline to 100 mg daily                  NAC 1200 mg twice a day    Return visit: 5 weeks     Duration:  20 minutes    Length of Visit:  20 minutes  Greater than 50 % of face to face time was spent in counseling/coordination of care

## 2017-07-30 NOTE — Progress Notes (Signed)
[facility] Session Note     PSYCH GROUP THERAPY    Is this a group service?:  Yes    Group Name:  [facility]  Group ID:  [id number]  Group Face to Face Duration:  90  Number of Participants:  5      Eligibility for previously-prescribed controlled substance:  The patient is NOT currently eligible to have a monthly prescription for previously-prescribed controlled substance. To support safety and adherence to treatment regimen, the following interval is recommended:  One week supply         Overall Focus/Topic   Chemical Dependency  Relapse Prevention    Goal of Group Session  Todays group discussed current stressors and how to maintain their abstience during it. The group provided each other with support.     Toxicology Screen: no  Last reported/recorded use: 07/08/17 - marijuana; 6/5 - opiates    Patient Participation/Response  Attitude Re Treatment: positive  Provided feedback: yes  Adhered to Group Norms: yes  Expressed Feelings: yes  Maintained Attention: yes  Self-disclosed Relevant Information: yes    Comment  Ronald Richard arrived to group on time and was orientated x3. He reported using a percocet yesterday but went a few days prior to that without using. He reported being irritable those days but didn't use. He reported plans to start the NAC as prescribed and is hoping it will help crave some of his craving for marijuana in which he is using opiates for. He further participated in his stress with balancing work out but is still conflicted as he finds his jobs enjoyable. He reported plans to attend [place] for a week for baseball soon but reported many of the ref's he attends with use. He reported he will have easy access. Writer encouraged him to develop an extensive relapse prevention plan for that week due to knowing others will ask him about using.

## 2017-07-31 ENCOUNTER — Ambulatory Visit: Payer: PRIVATE HEALTH INSURANCE

## 2017-07-31 DIAGNOSIS — F1120 Opioid dependence, uncomplicated: Secondary | ICD-10-CM

## 2017-08-01 ENCOUNTER — Other Ambulatory Visit: Payer: Self-pay | Admitting: Psychiatry

## 2017-08-01 MED ORDER — BUPRENORPHINE HCL-NALOXONE HCL 8-2 MG SL FILM *I*
1.0000 | ORAL_FILM | Freq: Every day | SUBLINGUAL | 0 refills | Status: DC
Start: 2017-08-01 — End: 2017-09-06

## 2017-08-01 NOTE — Telephone Encounter (Signed)
Request acknowledged by technician, sent to nursing pool for follow up.     buprenorphine-naloxone (SUBOXONE) 8-2 MG SL film, Place 1 Film under the tongue daily   Max daily dose: 1 Film     [facility] - [place], [place] - [address] [phone] (Phone)  [phone] (Fax)

## 2017-08-02 ENCOUNTER — Ambulatory Visit: Payer: PRIVATE HEALTH INSURANCE | Admitting: Psychiatry

## 2017-08-02 NOTE — Progress Notes (Signed)
[facility] Session Note     PSYCH GROUP THERAPY    Is this a group service?:  Yes    Group Name:  [facility]  Group ID:  [id number]  Group Face to Face Duration:  90  Number of Participants:  4      Eligibility for previously-prescribed controlled substance:  The patient is NOT currently eligible to have a monthly prescription for previously-prescribed controlled substance. To support safety and adherence to treatment regimen, the following interval is recommended:  One week supply         Overall Focus/Topic   Chemical Dependency  Relapse Prevention    Goal of Group Session  Today's group evaluated the cost benefit analysis of using. Each group member identified the advantage and disadvantage of using and not using.     Toxicology Screen: no  Last reported/recorded use: 07/08/17 - marijuana; 6/10 - opiates    Patient Participation/Response  Attitude Re Treatment: positive  Provided feedback: yes  Adhered to Group Norms: yes  Expressed Feelings: yes  Maintained Attention: yes  Self-disclosed Relevant Information: yes    Comment  Ronald Richard arrived to group on time and was orientated x3. He reported going part of the weekend without using but did use yesterday. He reported not taking his Suboxone when he decides to take an opiate. He reported doing it because "on those days I just want to get high". Writer expressed concern about his upcoming work trip as he reported he will have easy assess to opiates. He reported having a narcan kit with him to take. He further participated in the discuss and was able to identify the disadvantages but still struggles with the feeling of just wanting to get high, especially given his relationship struggles

## 2017-08-07 ENCOUNTER — Ambulatory Visit: Payer: PRIVATE HEALTH INSURANCE

## 2017-08-09 ENCOUNTER — Ambulatory Visit: Payer: PRIVATE HEALTH INSURANCE

## 2017-08-13 ENCOUNTER — Telehealth: Payer: Self-pay

## 2017-08-13 NOTE — Telephone Encounter (Signed)
Writer received a phone call from [place] who reported his girlfriend broke up with him last week. He reported being depressed over it all. He reported not taking his medications "as I was supposed to" last week as well as was drinking. He reported he hasn't used anything since Friday and started back on his medications. He didn't go to work today due to being to depressed to get out of bed. He denied any thoughts of wanting to harm himself or suicidal ideations, but did report telling them to his girlfriend. He reported doing this "because I was hurt and I wanted her to feel bad" but had no thoughts about it. Writer let him know about Mobile Crisis as well as going to the ED if he begins to have those thoughts. Writer and patient Social research officer, government seeing him before group tomorrow, in which he agreed to come at 5:00 PM.

## 2017-08-13 NOTE — Telephone Encounter (Signed)
Writer received a Engineer, technical salesvoicemail from [place] who requested a call back from Clinical research associatewriter.     Writer attempted to reach [place]in and left a message.

## 2017-08-14 ENCOUNTER — Ambulatory Visit: Payer: PRIVATE HEALTH INSURANCE

## 2017-08-14 DIAGNOSIS — F1120 Opioid dependence, uncomplicated: Secondary | ICD-10-CM

## 2017-08-15 ENCOUNTER — Ambulatory Visit: Payer: PRIVATE HEALTH INSURANCE

## 2017-08-15 DIAGNOSIS — F1120 Opioid dependence, uncomplicated: Secondary | ICD-10-CM

## 2017-08-15 NOTE — Progress Notes (Signed)
[facility] Session Note     PSYCH GROUP THERAPY    Is this a group service?:  Yes    Group Name:  [facility]  Group ID:  [id number]  Group Face to Face Duration:  90  Number of Participants:  3      Eligibility for previously-prescribed controlled substance:  The patient is NOT currently eligible to have a monthly prescription for previously-prescribed controlled substance. To support safety and adherence to treatment regimen, the following interval is recommended:  One week supply         Overall Focus/Topic   Chemical Dependency  Relapse Prevention    Goal of Group Session  Todays group discussed fears and the barriers they can create in recovery. They also discussed how to address their fears to prevent it from preventing their ability to move forward with their goals and recovery.     Toxicology Screen: no  Last reported/recorded use: 08/13/17 - marijuana & alcohol; 6/16 - opiates    Patient Participation/Response  Attitude Re Treatment: positive  Provided feedback: yes  Adhered to Group Norms: yes  Expressed Feelings: yes  Maintained Attention: yes  Self-disclosed Relevant Information: yes    Comment  Ronald Richard arrived to group on time and was orientated x3. He reported struggling lately and drank alcohol and smoked marijuana the last few days. He report no new opiate use this past week as he started taking his Suboxone like prescribed again. He further participated in the group discussion about fear and discussed being alone and losing everything to his addiction. He discussed not wanting to fail but is concerned about his current direction. He was provided support by the group.

## 2017-08-16 ENCOUNTER — Ambulatory Visit: Payer: PRIVATE HEALTH INSURANCE

## 2017-08-16 ENCOUNTER — Telehealth: Payer: Self-pay

## 2017-08-16 DIAGNOSIS — F1120 Opioid dependence, uncomplicated: Secondary | ICD-10-CM

## 2017-08-16 NOTE — Progress Notes (Signed)
[facility] Individual Progress Note     Duration:  30 minutes      Contact Type:  Location: On Site    Face to Face     Eligibility for previously-prescribed controlled substance:  The patient is NOT currently eligible to have a monthly prescription for previously-prescribed controlled substance. To support safety and adherence to treatment regimen, the following interval is recommended:  One week supply       Last Toxicology Screen Date: 07/06/17     Last reported/recorded use: 6/24- marijuana and alcohol      Treatment/Recovery Goals Addressed:    Treatment Problem #1 04/11/2017   1ST TREATMENT PLAN PROBLEM Ronald Richard has struggles to abstain from use of substances, which has created negative consequences in his life.     Treatment Goal #1 04/11/2017   1ST TREATMENT PLAN GOAL Ronald Richard will abstain from all substances and develop a lifestyle of recovery.     Treatment Problem #2 04/11/2017   2ND TREATMENT PLAN PROBLEM Ronald Richard has a history of mental health and is concerned about the impact it has had on his use and relationships.     Treatment Goal #2 04/11/2017   2ND TREATMENT PLAN GOAL Ronald Richard will develop an awareness of his mental health and the develop skills to cope without using substances.      Treatment Problem #3 04/11/2017   3RD TREATMENT PLAN PROBLEM Ronald Richard has limited family support and has yet to tell his girlfriend about his addiction and treatment.     Treatment Goal #3 04/11/2017   3RD TREATMENT PLAN GOAL Ronald Richard will further engage his girlfriend and family in his recovery.        Stress/Use/Mood/Risk Evaluation:    Stress     How have you been feeling?  "depressed"     Whats better since I last saw you?  "I haven't used an opiate in a week"     Has anything happened to increase your stress since the last time we met?  Yes how are you handling those things?  "not well"         Would you like to talk about any of those things today?  See below     On a scale of 10, with 10 as highest, how would you rate  your current stress level? N/A     Use     Have you relapsed since we last met and what were the circumstances surrounding the relapse?  Yes marijuana and alcohol yesterday      Have you used any AOD at all in the last week?  marijuana      Have you increased your use due to stressors since we last met?  No       Mood     Are you feeling depressed or anxious?  Yes depressed     Any trouble with disturbing or negative thoughts?  No         Risk (suicidal or homicidal)     Any thoughts of harming yourself or others?  Self Injurious Behavior: Patient Denies, Suicidal Ideation: Patient Denies, Homicidal Ideation: Patient Denies, Aggressive Behavior: Patient Denies      Domestic Violence Screen:  No indication to update domestic violence screen.    Session Content  Treatment plan developed with patient  Treatment plan reviewed with patient  Patient is in agreement with treatment plan       Evidence-based or Clinical Intervention Used:  Cognitive Behavioral Therapy  Motivational Interviewing  Family Counseling    Session summary/response to session:   Ronald Richard arrived to his session on time and as orientated x3. He updated Probation officer on his breakup and how he was feeling. He reported she broke up with him while he was in [place] due to his continued use and not being honest with her. She has planned to go to the [facility] for a year doing travel nursing and may stay for up to three. He reported she told him she doesn't want a reason "to return to [place]" and wants to be by herself while having this distance. He reported staying in bed all weekend and today since he has found out due to being depressed. He denied any thoughts of wanting to harm himself or harm others at this time.     He reported he started taking his medication as prescribed and hasn't used an opiate since last week. He returned back to taking his zoloft, but is struggling to get motivated. Writer and patient discussed this loss and how the  breakup will cause him to go through the grieving process. He reported being concerned due to his "obsessive thoughts", which when he explained further, were related to what he could have done better or could do to get her back. He doesn't believe he can get her back though and is beating himself up about it. Writer discussed a plan for the next few days, which included attending group today and him going home and eating due to reporting having little motivation to eat the last two days. He discussed getting an outfit out for group and taking his suboxone in the morning to help get motivation to return to work. Writer discussed keeping his individual tomorrow with Probation officer to further discuss his break up and coping.     Plan  Continue Treatment as planned.    Current Treatment Plan   Created/Updated On 06/13/2017   Next Treatment Plan Due 09/14/2017       (Please complete the Domestic Abuse and Neglect screening question on the Screening Navigator during treatment plan review session)

## 2017-08-16 NOTE — Telephone Encounter (Signed)
Writer received a voicemail from [place] who was asking about a note for work. Writer left a message asking for a call back.

## 2017-08-17 ENCOUNTER — Other Ambulatory Visit
Admission: RE | Admit: 2017-08-17 | Discharge: 2017-08-17 | Disposition: A | Discharge status: Home / Self Care | Payer: PRIVATE HEALTH INSURANCE | Source: Ambulatory Visit | Attending: Psychiatry | Admitting: Psychiatry

## 2017-08-17 DIAGNOSIS — Z0189 Encounter for other specified special examinations: Secondary | ICD-10-CM | POA: Insufficient documentation

## 2017-08-17 LAB — CHEM DEPEND + ETOH SCRN, URINE
Amphetamine,UR: NEGATIVE
Benzodiazepinen,UR: NEGATIVE
Cocaine/Metab,UR: NEGATIVE
Ethanol,UR: NEGATIVE
Opiates,UR: NEGATIVE
Oxycodone/Oxymorphone,UR: NEGATIVE
THC Metabolite,UR: POSITIVE

## 2017-08-17 LAB — ETHYL GLUCURONIDE, UR: Ethyl glucuronide, Ur: NEGATIVE

## 2017-08-17 LAB — DATE/TIME NOT PROVIDED

## 2017-08-17 NOTE — Progress Notes (Signed)
[facility] Progress Note     Duration:  30 minutes      Contact Type:  Location: On Site    Face to Face     Eligibility for previously-prescribed controlled substance:  The patient is NOT currently eligible to have a monthly prescription for previously-prescribed controlled substance. To support safety and adherence to treatment regimen, the following interval is recommended:  One week supply       Last Toxicology Screen Date: 07/06/17     Last reported/recorded use: 6/24- marijuana and alcohol      Treatment/Recovery Goals Addressed:    Treatment Problem #1 04/11/2017   1ST TREATMENT PLAN PROBLEM Ronald Richard has struggles to abstain from use of substances, which has created negative consequences in his life.     Treatment Goal #1 04/11/2017   1ST TREATMENT PLAN GOAL Ronald Richard will abstain from all substances and develop a lifestyle of recovery.     Treatment Problem #2 04/11/2017   2ND TREATMENT PLAN PROBLEM Ronald Richard has a history of mental health and is concerned about the impact it has had on his use and relationships.     Treatment Goal #2 04/11/2017   2ND TREATMENT PLAN GOAL Ronald Richard will develop an awareness of his mental health and the develop skills to cope without using substances.      Treatment Problem #3 04/11/2017   3RD TREATMENT PLAN PROBLEM Ronald Richard has limited family support and has yet to tell his girlfriend about his addiction and treatment.     Treatment Goal #3 04/11/2017   3RD TREATMENT PLAN GOAL Ronald Richard will further engage his girlfriend and family in his recovery.        Stress/Use/Mood/Risk Evaluation:    Stress     How have you been feeling?  "not much better"     Whats better since I last saw you?  "I ate last night"     Has anything happened to increase your stress since the last time we met?  Yes how are you handling those things?  'still the same"         Would you like to talk about any of those things today?  See below     On a scale of 10, with 10 as highest, how would you rate your  current stress level? N/A     Use     Have you relapsed since we last met and what were the circumstances surrounding the relapse?  No      Have you used any AOD at all in the last week?  denied      Have you increased your use due to stressors since we last met?  No       Mood     Are you feeling depressed or anxious?  Yes depressed     Any trouble with disturbing or negative thoughts?  No         Risk (suicidal or homicidal)     Any thoughts of harming yourself or others?  Self Injurious Behavior: Patient Denies, Suicidal Ideation: Patient Denies, Homicidal Ideation: Patient Denies, Aggressive Behavior: Patient Denies      Domestic Violence Screen:  No indication to update domestic violence screen.    Session Content  Treatment plan developed with patient  Treatment plan reviewed with patient  Patient is in agreement with treatment plan       Evidence-based or Clinical Intervention Used:  Cognitive Behavioral Therapy  Motivational Interviewing  Family Counseling  Session summary/response to session:   Ronald Richard arrived to his session on time and as orientated x3. He updated Probation officer on his night, which he reported going home and getting something to eat and getting his clothes out for work. However, he did not get out of bed to get to work. He continued to sleep up until his appointment with Probation officer. He is hoping to return to work tomorrow. He reported he also did not take either of his medications this morning, which he discussed with Probation officer a plan to take when he gets home.     He further discussed his break up and concerns about his obsessive thoughts. He further discussed having these thoughts when going through breakups in the past but is respecting her wishes of having distance at this time. He discussed the plan for the next month with her, as she will be returning to the house for a week, leaving to [place] for one week, then returning for two weeks. He is unsure how this is going to go with him being  there, but is trying to mentally process the break up now to prevent him from fully regressing again when she comes home and he is around her.     He further discussed his past relationships and the break ups. He reported having two other significant relationships that both ended due to his use. His first was due to excessive drinking and the second when "she thought I was using more of the drugs then her". He reported when the first ended, he continued to call and text her which resulted in a restraining order. His second he soon met his current girlfriend which he found helped. He reported wanting to continue to maintain healthy boundaries with this breakup and prevent it getting to the point where the first went. Writer encouraged him to start engaging in positive activities to keep him busy, especially exploring options for meetings which he hasn't done here.     Plan  Continue Treatment as planned.    Current Treatment Plan   Created/Updated On 06/13/2017   Next Treatment Plan Due 09/14/2017       (Please complete the Domestic Abuse and Neglect screening question on the Screening Navigator during treatment plan review session)

## 2017-08-20 LAB — THC SEMI-QUANT, URINE
Semi-Quant THC,UR: 420 ng/mL
THC/Creat Ratio: 159 ng/mg

## 2017-08-20 NOTE — Progress Notes (Signed)
[facility] Session Note     PSYCH GROUP THERAPY    Is this a group service?:  Yes    Group Name:  [facility]  Group ID:  [id number]  Group Face to Face Duration:  90  Number of Participants:  5      Eligibility for previously-prescribed controlled substance:  The patient is NOT currently eligible to have a monthly prescription for previously-prescribed controlled substance. To support safety and adherence to treatment regimen, the following interval is recommended:  One week supply         Overall Focus/Topic   Chemical Dependency  Relapse Prevention    Goal of Group Session  Todays group discussed losses related to addiction and processing those. The group provided support to some members and discussed how to process those while struggling with one's own addiction.     Toxicology Screen: no  Last reported/recorded use: 08/13/17 - marijuana & alcohol; 6/16 - opiates    Patient Participation/Response  Attitude Re Treatment: positive  Provided feedback: yes  Adhered to Group Norms: yes  Expressed Feelings: yes  Maintained Attention: yes  Self-disclosed Relevant Information: yes    Comment  Ronald Richard arrived to group on time and was orientated x3. He reported no use the past few days, but was also not getting out of bed. He is hoping to return to work tomorrow and was encouraged to find sober  activities this weekend that he could enjoyable. He also has plans to start apartment searching this weekend and was given advice on areas that may be triggering (I.e. Known for bars, parties). He further participated in the group discussion and provided support to another group member.

## 2017-08-21 ENCOUNTER — Ambulatory Visit: Payer: PRIVATE HEALTH INSURANCE | Attending: Psychiatry

## 2017-08-21 DIAGNOSIS — F1020 Alcohol dependence, uncomplicated: Secondary | ICD-10-CM | POA: Insufficient documentation

## 2017-08-21 DIAGNOSIS — F1220 Cannabis dependence, uncomplicated: Secondary | ICD-10-CM | POA: Insufficient documentation

## 2017-08-21 DIAGNOSIS — F1120 Opioid dependence, uncomplicated: Secondary | ICD-10-CM

## 2017-08-21 DIAGNOSIS — F419 Anxiety disorder, unspecified: Secondary | ICD-10-CM | POA: Insufficient documentation

## 2017-08-21 DIAGNOSIS — F329 Major depressive disorder, single episode, unspecified: Secondary | ICD-10-CM | POA: Insufficient documentation

## 2017-08-21 LAB — BUPRENORPHINE, UR QNT
BUPREN GLUC,UR QNT: 318 ng/mL
BUPRENORPHINE,UR QNT: 27 ng/mL
Creatinine,UR: 264 mg/dL (ref 20–300)
NORBUPREN GLUC,UR QNT: 222 ng/mL
NORBUPRENORPHINE,UR QNT: 85 ng/mL
TOTAL BUP/CREAT, UR QNT: 98 ng/mg
TOTAL BUPREN,UR QNT: 258 ng/mL
TOTAL NORBUP/CREAT,UR QNT: 91 ng/mg
TOTAL NORBUPREN,UR QNT: 241 ng/mL

## 2017-08-26 NOTE — Progress Notes (Signed)
[facility] Session Note     PSYCH GROUP THERAPY    Is this a group service?:  Yes    Group Name:  [facility]  Group ID:  [id number]  Group Face to Face Duration:  90  Number of Participants:  6      Eligibility for previously-prescribed controlled substance:  The patient is NOT currently eligible to have a monthly prescription for previously-prescribed controlled substance. To support safety and adherence to treatment regimen, the following interval is recommended:  One week supply         Overall Focus/Topic   Chemical Dependency  Relapse Prevention    Goal of Group Session  Today's group discussed the holiday and any triggers related to socializing during the summer. The group discussed sober activities within the communty.     Toxicology Screen: no  Last reported/recorded use: 7/1 - marijuana; 7/1 - opiates    Patient Participation/Response  Attitude Re Treatment: positive  Provided feedback: yes  Adhered to Group Norms: yes  Expressed Feelings: yes  Maintained Attention: yes  Self-disclosed Relevant Information: yes    Comment  [name] arrived to group on time and was orientated x3. He reported returning back to work this week and is overwhelmed by being behind. He is planning on working for the holiday but did report plans to go to a party this weekend. When asked about any use at this party, he reported plans to drink. Writer expressed concern about his use of alcohol with his medications and as given feedback by the group. Writer spoke to Goldman Sachshm after group as well about the alcohol and concerns about hs continued use and what appears to be a lack of interest in recovery.

## 2017-08-28 ENCOUNTER — Ambulatory Visit: Payer: PRIVATE HEALTH INSURANCE

## 2017-08-28 DIAGNOSIS — F1120 Opioid dependence, uncomplicated: Secondary | ICD-10-CM

## 2017-08-29 ENCOUNTER — Ambulatory Visit: Payer: PRIVATE HEALTH INSURANCE

## 2017-08-29 DIAGNOSIS — F1120 Opioid dependence, uncomplicated: Secondary | ICD-10-CM

## 2017-08-30 ENCOUNTER — Ambulatory Visit: Payer: PRIVATE HEALTH INSURANCE | Admitting: Psychiatry

## 2017-08-30 ENCOUNTER — Telehealth: Payer: Self-pay | Admitting: Psychiatry

## 2017-08-30 ENCOUNTER — Ambulatory Visit: Payer: PRIVATE HEALTH INSURANCE

## 2017-08-30 DIAGNOSIS — F1120 Opioid dependence, uncomplicated: Secondary | ICD-10-CM

## 2017-08-30 NOTE — Progress Notes (Signed)
[facility] Session Note     PSYCH GROUP THERAPY    Is this a group service?:  Yes    Group Name:  [facility]  Group ID:  [id number]  Group Face to Face Duration:  90  Number of Participants:  7      Eligibility for previously-prescribed controlled substance:  The patient is NOT currently eligible to have a monthly prescription for previously-prescribed controlled substance. To support safety and adherence to treatment regimen, the following interval is recommended:  One week supply         Overall Focus/Topic   Chemical Dependency  Relapse Prevention    Goal of Group Session  Today's group discussed forgiveness, specifically what is forgiveness, the benefits of it, and how to reach a state of forgiveness      Toxicology Screen: no  Last reported/recorded use: 7/8 - marijuana; 7/8 - opiates    Patient Participation/Response  Attitude Re Treatment: positive  Provided feedback: yes  Adhered to Group Norms: yes  Expressed Feelings: yes  Maintained Attention: yes  Self-disclosed Relevant Information: yes    Comment  Ronald Richard arrived to group on time and was orientated x3. He reported wanting to "pass" for group check in, but did report getting some sleep. Ronald Richard was distracted during group and had to be re-directed a few times by writer due to being on his phone and putting his head down. Writer asked him after if he was doing alright, in which he just stated "I'll be fine".

## 2017-08-30 NOTE — Telephone Encounter (Signed)
Patient called writer 30 minutes prior to appointment and left message stating that he had double booked himself today and also had a noon appointment at work - was planning on coming to group tonight - writer will leave an appointment for him with group counselor.    Of note, writer did met briefly with patient and counselor yesterday to discuss his progress in treatment and ongoing use which writer was planning on exploring further with him today at his appointment.

## 2017-08-30 NOTE — Progress Notes (Signed)
[facility] Session Note     PSYCH GROUP THERAPY    Is this a group service?:  Yes    Group Name:  [facility]  Group ID:  [id number]  Group Face to Face Duration:  90  Number of Participants:  5        Overall Focus/Topic   Chemical Dependency  Leisure Time    Goal of Group Session  Participants completed a leisure activity inventory.     Toxicology Screen: no  Last reported/recorded use: 08/30/2017    Patient Participation/Response  Attitude Re Treatment: positive  Provided feedback: yes  Adhered to Group Norms: yes  Expressed Feelings: yes  Maintained Attention: yes  Self-disclosed Relevant Information: yes    Comment  Ronald Richard arrives on time and oriented X3. He shares that he does not have plans for the weekend. He participates in an activity and discussion on leisure skills, Ronald Richard shares that he previously enjoyed playing cards and attending sports events but he associates these with his substance use. Ronald Richard would like to try camping this weekend, and star gazing. [name]'s take away is that "it is important to take time for leisure to prevent burnout."

## 2017-09-03 ENCOUNTER — Ambulatory Visit: Payer: PRIVATE HEALTH INSURANCE

## 2017-09-03 NOTE — Progress Notes (Signed)
[facility] Progress Note     Duration:  45 minutes      Contact Type:  Location: On Site    Face to Face     Eligibility for previously-prescribed controlled substance:  The patient is NOT currently eligible to have a monthly prescription for previously-prescribed controlled substance. To support safety and adherence to treatment regimen, the following interval is recommended:  One week supply       Last Toxicology Screen Date: 08/17/17     Last reported/recorded use: 08/28/17      Treatment/Recovery Goals Addressed:    Treatment Problem #1 04/11/2017   1ST TREATMENT PLAN PROBLEM Ronald Richard has struggles to abstain from use of substances, which has created negative consequences in his life.     Treatment Goal #1 04/11/2017   1ST TREATMENT PLAN GOAL Ronald Richard will abstain from all substances and develop a lifestyle of recovery.     Treatment Problem #2 04/11/2017   2ND TREATMENT PLAN PROBLEM Ronald Richard has a history of mental health and is concerned about the impact it has had on his use and relationships.     Treatment Goal #2 04/11/2017   2ND TREATMENT PLAN GOAL Ronald Richard will develop an awareness of his mental health and the develop skills to cope without using substances.      Treatment Problem #3 04/11/2017   3RD TREATMENT PLAN PROBLEM Ronald Richard has limited family support and has yet to tell his girlfriend about his addiction and treatment.     Treatment Goal #3 04/11/2017   3RD TREATMENT PLAN GOAL Ronald Richard will further engage his girlfriend and family in his recovery.        Domestic Violence Screen:  No indication to update domestic violence screen.    Session Content  Treatment plan developed with patient  Treatment plan reviewed with patient  Patient is in agreement with treatment plan       Evidence-based or Clinical Intervention Used:  Cognitive Behavioral Therapy  Motivational Interviewing  Family Counseling    Session summary/response to session:   Ronald Richard arrived to his session on time and as orientated x3. Clinical research associate  and Ronald Richard were joined by Billy Fischer, NP due to Clinical research associate and providers concerns regarding his continued use, as well as increase of alcohol use. He reported drinking alcohol, smoking marijuana, and using opiates earlier in the weekend. He reported not taking his Suboxone to be able to take a percocet when he does that. Writer and provider expressed concern at him not following through with his medications as prescribed, as well as drinking alcohol on top of his prescription. He further reported he stopped taking his Zoloft and feels he was having side effects from coming off, which he explained is the reason he went off of it. He was reminded that his use, rather then his prescription, would impact his depression.     Ronald Richard denied being able to do inpatient after Clinical research associate and provider discussed this option. He reported not having the time with work to be able to leave but would be open to it if he could. However, he was informed it could be the official recommendation and be expected for him to be able to continue to receive his prescription and be in treatment. He further was informed if he is unable to take his prescription as provided, he could face possible recommendation to methadone program. A treatment contract was discussed and pending 30 days, if he doesn't comply, he will be officially recommendation and expected to attend  inpatient. After Eve Andersen-Buescher left the session, writer further discussed on him starting Relapse Prevention Group as well and maintaining his attendance as part of the contract. He was agreeable to seeing a family therapist to process his relationships and the impact his addiction has as well. Writer further expressed concern about his behavior in group as he was on his phone and kept putting his head down. He apologized and reported being distracted as his ex-girlfriend had asked to come get her belongings on the 20th. Writer let him know he will be asked to leave group  if he behaves that way again and it will be part of his contact. Writer let him know she will have the contract printed out and provide him a copy at group.    Plan  Continue Treatment as planned.   Writer will provide him with his contract in group and have him sign it.  He will be placed in Relapse Prevention Group as well as continue with [facility].  He will be referred to a family therapist to help process how his addiction has impacted his relationships.    Current Treatment Plan   Created/Updated On 06/13/2017   Next Treatment Plan Due 09/14/2017       (Please complete the Domestic Abuse and Neglect screening question on the Screening Navigator during treatment plan review session)

## 2017-09-04 ENCOUNTER — Ambulatory Visit: Payer: PRIVATE HEALTH INSURANCE

## 2017-09-04 ENCOUNTER — Other Ambulatory Visit: Payer: Self-pay | Admitting: Psychiatry

## 2017-09-04 ENCOUNTER — Telehealth: Payer: Self-pay

## 2017-09-04 NOTE — Telephone Encounter (Signed)
Writer attempted to reach Ronald Richard due to his no call no show for group. Writer left a message expressing concern due to him not being compliant with what was discussed last week.

## 2017-09-04 NOTE — Telephone Encounter (Signed)
Request acknowledged by technician, sent to nursing pool for follow up.     buprenorphine-naloxone (SUBOXONE) 8-2 MG SL film, Place 1 Film under the tongue daily   Max daily dose: 1 Film     [facility] - [place], [place] - [address] [phone] (Phone)  [phone] (Fax)

## 2017-09-06 ENCOUNTER — Other Ambulatory Visit
Admission: RE | Admit: 2017-09-06 | Discharge: 2017-09-06 | Disposition: A | Discharge status: Home / Self Care | Payer: PRIVATE HEALTH INSURANCE | Source: Ambulatory Visit | Attending: Psychiatry | Admitting: Psychiatry

## 2017-09-06 ENCOUNTER — Ambulatory Visit: Payer: PRIVATE HEALTH INSURANCE

## 2017-09-06 ENCOUNTER — Encounter: Payer: Self-pay | Admitting: Psychiatry

## 2017-09-06 ENCOUNTER — Ambulatory Visit: Payer: PRIVATE HEALTH INSURANCE | Admitting: Psychiatry

## 2017-09-06 VITALS — BP 134/61 | HR 72 | Wt 276.0 lb

## 2017-09-06 DIAGNOSIS — Z0189 Encounter for other specified special examinations: Secondary | ICD-10-CM | POA: Insufficient documentation

## 2017-09-06 DIAGNOSIS — F32A Depression, unspecified: Secondary | ICD-10-CM

## 2017-09-06 DIAGNOSIS — F1220 Cannabis dependence, uncomplicated: Secondary | ICD-10-CM

## 2017-09-06 DIAGNOSIS — F1120 Opioid dependence, uncomplicated: Secondary | ICD-10-CM

## 2017-09-06 DIAGNOSIS — F419 Anxiety disorder, unspecified: Secondary | ICD-10-CM

## 2017-09-06 MED ORDER — CLONIDINE HCL 0.1 MG PO TABS *I*
0.1000 mg | ORAL_TABLET | Freq: Every evening | ORAL | 1 refills | Status: DC
Start: 2017-09-06 — End: 2018-01-08

## 2017-09-06 MED ORDER — DOCUSATE SODIUM 100 MG PO CAPS *I*
100.0000 mg | ORAL_CAPSULE | Freq: Two times a day (BID) | ORAL | 1 refills | Status: AC
Start: 2017-09-06 — End: ?

## 2017-09-06 MED ORDER — BUPRENORPHINE HCL-NALOXONE HCL 8-2 MG SL FILM *I*
1.0000 | ORAL_FILM | Freq: Every day | SUBLINGUAL | 0 refills | Status: DC
Start: 2017-09-06 — End: 2017-09-13

## 2017-09-06 NOTE — Progress Notes (Addendum)
[facility]NAG[facility]         HISTORY     Chief Complaint/Reason for Encounter: suboxone follow up, depressed mood    History of Presenting Illness: Patient reports beginning to use opiates at age 32 and reports that ' there is not much that he has not tried' in his past; has had no previous chemical dependency treatment, has struggled with depression and anxiety.  Feels that his main issues are opiates ( oxy) and marijuana, has a h/o binge drinking which he feels he is managing as he has managed to decrease his use over the past year.  Denies any h/o seizures, does report symptoms of withdrawal from alcohol.    On contact today states that he missed group on Monday and Tuesday, had a friend from [place] who came up to [place] and stayed with them in their RV - feels it helped his mood. Using marijuana daily, has not been taking sertraline for the past month and does not want to restart, does feel clonidine helpful when anxious.  Acknowledges not taking suboxone on days when he wants to use opiate pills.    Past, Family, Social History:  Relocated to [facility] May 2018, working full time as a Comptroller at [employer]       Past Psychiatric History:     Bipolar Depression    Psychiatric Hospitalizations: denies     Psychiatric Outpatient: yes    had a therapist and psychiatrist in [place] - last saw 2015   Intake at [facility] 12/25/16    CD Treatment:denies    Review of Past Psychiatric Med Trials:    seroquel - helped me sleep   lamictal - not sure if it worked  - was still using other drugs and drinking heavily    Family Psychiatric & Substance Use History:   Maternal Uncle - ' heavy alcoholic'   Youngest brother - marijuana    Review of Systems & Active Medical Problems:  (1 system = Problem Pertinent; 2-9 systems = Extended; 10 or more systems or some systems noted as "all others negative"= Complete)    See pertinent info as in HPI.      Current Medication Side Effects: denies    Last Alcohol & Drug Use:    Alcohol: 09/04/17 - wine  Nicotine: denies  Cannabis: 09/06/17  Cocaine: 03/17/17  Opiates: 09/04/17 -  Synthetic Cannabinoids: denies  Synthetic Stimulants ("Bath Salts"): denies  Non-prescribed Benzodiazepines: 05/2016 - xanax  Mushrooms:2017  Acid/LSD:' twice in my life'  Ecstasy:age 46 - 21 ' very heavily'  Molly:  " used it maybe 5 times in my life  Meth: 2016  Adderall: 2016 - don't like, make me anxious    Longest sobriety: 1 1/2 years  - age 38     Buprenorphine Follow Up:    Cravings: none  Side Effects: none  Toxicology:08/17/17  - (+) THC  Films Remaining: 0    Checked [place] database today. No unexpected prescriptions were found in this database.     PHQ 02/27/2017   PHQ-9 Total Score 6       New Medical Hx: denies        PSYCHIATRIC  EXAMINATION   (1-5 bullets = Problem Focused; 6-8 bullets = Expanded Problem Focused; At least 9 bullets = Detailed; Completion of All bulleted items - Comprehensive). Note: Vital Signs are weighted as one system area)    BP 134/61  Pulse 72    Wt 125.2 kg (276 lb)    BMI 37.43 kg/m     Appearance:  Casual  Manner:  Cooperative  Musculoskeletal:  WNL -  Speech:  Normal rate, Normal volume, southern accent  Thought Process:  Normal  Description of Associations:  Goal Directed, Logical   Description of Abnormal or Psychotic Thoughts (Includes Safety Risk):  Thought Content: No unusual themes  Perception: No evidence of hallucinations   Safety Risk: Suicidal Ideation: Patient Denies, Homicidal Ideation: Patient Denies  Description of Judgment and Insight:  Intact judgment  Good insight  Orientation:  Alert and Oriented X 3.  Memory (Recent/Remote):  intact  Attention/Concentration:  WNL  Language:  No evidence of impairment in language, comprehension, or expression noted on today's exam.  Fund of Knowledge:  Intact  Mood and Affect  Neutral  mood  appropriate affect    ASSESSMENT OF RISK FOR SUICIDAL  BEHAVIOR  Changes in risk for suicide from baseline Formulation of Risk and/or previous intake, including newly identified risk, if any: none    ASSESSMENT OF RISK FOR VIOLENT BEHAVIOR  Changes in risk for violence from baseline Formulation of Risk and/or previous intake, including newly identified risk, if any: none      MEDICAL DECISION MAKING     Diagnoses:    ICD-10-CM ICD-9-CM   1. Opioid use disorder, severe, dependence F11.20 304.00   2. Cannabis use disorder, moderate, dependence F12.20 304.30   3. Depression, unspecified depression type F32.9 311   4. Anxiety F41.9 300.00       Formulation:  Ronald KenningJustin Richard is a 32 y.o. Caucasian male with polysubstance use disorder, most notably opiates, cannabis and alcohol, who presents today for follow up after suboxone induction 03/27/17.  Discussed with him concerns about recent use and fact that he does not take his suboxone every day particularly if he knows he is going to use or see people that he uses with.  Has returned to daily marijuana use, minimizing alcohol use.  Has stopped sertraline as does not want to have to take something for his mod and also that he will have withdrawal from if he stops - reflected back to him the disparity in this thought as he continues to use both opiates and cannabis and both of those have withdrawal symptoms.  Touched on the impact of his substance use on his relationships but somewhat defensive about this.  Discussed possibility of methadone as less chance of diversion, patient not wanting to have to come to clinic daily and also not happy that he would be expected to stop smoking marijuana in order to earn take homes.  At present time will continue with suboxone and clonidine.  Denies SI/HI/AH/VH, denies any safety concerns.     Medications/Medical Records/Labs/Diagnostic Tests Reviewed:  Current Outpatient Prescriptions   Medication Sig    cloNIDine (CATAPRES) 0.1 MG tablet Take 1 tablet (0.1 mg total) by mouth nightly     buprenorphine-naloxone (SUBOXONE) 8-2 MG SL film Place 1 Film under the tongue daily   Max daily dose: 1 Film    docusate sodium (COLACE) 100 MG capsule Take 1 capsule (100 mg total) by mouth 2 times daily       Counseling/Coordination of Care:  [x]  Diagnostic results/impressions and/or recommended studies were discussed   [x]  Instruction for management/treatment/followup discussed  [x]  Risks and benefits of treatment options discussed  [x]  Education provided to patient/family/caregiver  []  Side effects and benefits of his medications were discussed  []   Side effect management discussed  [x]  Adjustments of medication timing discussed  []  Risk factor reduction discussed  []  Prognosis discussed  [x]  Importance of adherence to treatment regimen discussed  []  Nicotine dependence and quitting were discussed.  []  Relapse prevention discussed    Plan:  Suboxone:  8 mg daily SL - weekly scripts  Decrease:   Clonidine 0.1 mg once a day                     Discontinue: sertraline to 100 mg daily                     Return visit: 4 weeks     Duration:  20 minutes  Greater than 50 % of face to face time was spent in counseling/coordination of care

## 2017-09-07 NOTE — Progress Notes (Signed)
[facility] Session Note     PSYCH GROUP THERAPY    Is this a group service?:  Yes    Group Name:  [facility]  Group ID:  [id number]  Group Face to Face Duration:  90  Number of Participants:  7      Eligibility for previously-prescribed controlled substance:  The patient is NOT currently eligible to have a monthly prescription for previously-prescribed controlled substance. To support safety and adherence to treatment regimen, the following interval is recommended:  One week supply         Overall Focus/Topic   Chemical Dependency  Relapse Prevention    Goal of Group Session  Today's group watched a TedTalk about addiction and surrounding oneself in a sober environment.     Toxicology Screen: no  Last reported/recorded use: 7/17 - marijuana; 7/16 - opiates and alcohol    Patient Participation/Response  Attitude Re Treatment: positive  Provided feedback: yes  Adhered to Group Norms: yes  Expressed Feelings: yes  Maintained Attention: yes  Self-disclosed Relevant Information: yes    Comment  Ronald Richard arrived to group on time and was orientated x3. He reported missing group due to visiting a friend who was up. During that time, he went to a few winers, which Clinical research associatewriter expressed concern about. Writer reminded him about his sessions last week about abstaining, in which he verbalized understanding. He further watched the TedTalk and listened as others provided input regarding it.

## 2017-09-08 LAB — CHEM DEPEND + ETOH SCRN, URINE
Amphetamine,UR: NEGATIVE
Benzodiazepinen,UR: NEGATIVE
Cocaine/Metab,UR: NEGATIVE
Ethanol,UR: NEGATIVE
Opiates,UR: NEGATIVE
Oxycodone/Oxymorphone,UR: POSITIVE
THC Metabolite,UR: POSITIVE

## 2017-09-08 LAB — FENTANYL,UR (SMH, HH, FFT): Fentanyl, UR: NEGATIVE ng/mL

## 2017-09-08 LAB — ETHYL GLUCURONIDE, UR: Ethyl glucuronide, Ur: NEGATIVE

## 2017-09-10 ENCOUNTER — Ambulatory Visit: Payer: PRIVATE HEALTH INSURANCE

## 2017-09-10 DIAGNOSIS — F1120 Opioid dependence, uncomplicated: Secondary | ICD-10-CM

## 2017-09-11 ENCOUNTER — Ambulatory Visit: Payer: PRIVATE HEALTH INSURANCE

## 2017-09-11 LAB — CONFIRM OPIATES: Confirm Opiates: POSITIVE

## 2017-09-11 LAB — THC SEMI-QUANT, URINE
Semi-Quant THC,UR: 1575 ng/mL
THC/Creat Ratio: 733 ng/mg

## 2017-09-11 NOTE — Progress Notes (Signed)
[facility] Session Note     PSYCH GROUP THERAPY    Is this a group service?:  Yes    Group Name:  Relapse Prevention  Group ID:  [id number]  Group Face to Face Duration:  60  Number of Participants:  10      Eligibility for previously-prescribed controlled substance:  The patient is NOT currently eligible to have a monthly prescription for previously-prescribed controlled substance. To support safety and adherence to treatment regimen, the following interval is recommended:  One week supply         Overall Focus/Topic   Chemical Dependency  Relapse Prevention    Goal of Group Session  Today's group discussed triggers    Toxicology Screen: no  Last reported/recorded use: 7/22 - marijuana this morning    Patient Participation/Response  Attitude Re Treatment: positive  Provided feedback: yes  Adhered to Group Norms: yes  Expressed Feelings: yes  Maintained Attention: yes  Self-disclosed Relevant Information: yes    Comment  [name] arrived to group on time and was orientated x3. He reported getting a lot of work accomplished this past week and weekend. He further participated in the group discussion and identified his triggers. He as encouraged to further examine them and how to process, specifically when it comes to social situations where he continues to use.

## 2017-09-12 ENCOUNTER — Ambulatory Visit: Payer: PRIVATE HEALTH INSURANCE

## 2017-09-12 DIAGNOSIS — F1120 Opioid dependence, uncomplicated: Secondary | ICD-10-CM

## 2017-09-13 ENCOUNTER — Other Ambulatory Visit: Payer: Self-pay | Admitting: Psychiatry

## 2017-09-13 ENCOUNTER — Ambulatory Visit: Payer: PRIVATE HEALTH INSURANCE

## 2017-09-13 DIAGNOSIS — F419 Anxiety disorder, unspecified: Secondary | ICD-10-CM

## 2017-09-13 DIAGNOSIS — F32A Depression, unspecified: Secondary | ICD-10-CM

## 2017-09-13 DIAGNOSIS — F1220 Cannabis dependence, uncomplicated: Secondary | ICD-10-CM

## 2017-09-13 DIAGNOSIS — F1410 Cocaine abuse, uncomplicated: Secondary | ICD-10-CM

## 2017-09-13 DIAGNOSIS — F1120 Opioid dependence, uncomplicated: Secondary | ICD-10-CM

## 2017-09-13 DIAGNOSIS — F1020 Alcohol dependence, uncomplicated: Secondary | ICD-10-CM

## 2017-09-13 DIAGNOSIS — Z638 Other specified problems related to primary support group: Secondary | ICD-10-CM

## 2017-09-13 LAB — BUPRENORPHINE, UR QNT
BUPREN GLUC,UR QNT: 341 ng/mL
BUPRENORPHINE,UR QNT: 17 ng/mL
Creatinine,UR: 215 mg/dL (ref 20–300)
NORBUPREN GLUC,UR QNT: 112 ng/mL
NORBUPRENORPHINE,UR QNT: 40 ng/mL
TOTAL BUP/CREAT, UR QNT: 123 ng/mg
TOTAL BUPREN,UR QNT: 265 ng/mL
TOTAL NORBUP/CREAT,UR QNT: 55 ng/mg
TOTAL NORBUPREN,UR QNT: 119 ng/mL

## 2017-09-13 MED ORDER — BUPRENORPHINE HCL-NALOXONE HCL 8-2 MG SL FILM *I*
1.0000 | ORAL_FILM | Freq: Every day | SUBLINGUAL | 0 refills | Status: DC
Start: 2017-09-13 — End: 2017-09-19

## 2017-09-13 NOTE — BH Treatment Plan (Signed)
[facility] Person-Centered Treatment Plan     Date of Plan:   [facility]:   Created/Updated On 09/13/2017   FROM 09/14/2017      Created/Updated On 09/13/2017   TO 12/12/2017       Service Location: [facility]     Diagnostic Impression    ICD-10-CM ICD-9-CM   1. Opioid use disorder, severe, dependence F11.20 304.00   2. Cannabis use disorder, moderate, dependence F12.20 304.30   3. Alcohol use disorder, severe, dependence F10.20 303.90   4. Cocaine use disorder, mild, abuse F14.10 305.60   5. Personal relationship breakdown Z65.8 V62.81   6. Anxiety F41.9 300.00   7. Depression, unspecified depression type F32.9 311       Patient Strengths and Limitations:   Social/Recreational:  Deferred   Ronald Richard lacks local supports and is wanting to engage in community resources. This will be addressed further into his treatment  Family:  Limitation   Ronald Richard's family is not local and they are unaware of his current addiction struggles.  Physical Health:  Deferred   Ronald Richard has been decreasing on exercising and taking care of his health.  Spiritual:  N/A  Gender Identity:  N/A  Sexual Orientation:  N/A  Education:  Strength  Employment:  Strength  Full time employment  Housing:  Sales executive housing  Community Integration:  N/A  Cultural/Language:  N/A  Outreach Services:  N/A  Other:  N/A  Additional Items:  Mental/Emotional:  Active  Substance Use:  Active  Legal:  N/A  Problem Gambling:  N/A      Domestic Violence:   Patient and/or identification tool has not identified the presence of domestic violence at this time.  ________________________________________________________________    Substance Use Treatment Goals:  Author: Dellia Nims, MA    MEDICATION SUPPORTED RECOVERY:  Opioid Replacement Therapy: Buprenorphine    COLLABORATIVE CARE SESSION:yes    DATE OF LAST PHYSICAL EXAM:  Not Applicable    Treatment Problem #1 04/11/2017   1ST TREATMENT PLAN PROBLEM Ronald Richard has struggles to abstain from use of substances, which has  created negative consequences in his life.       Treatment Goal #1 04/11/2017   1ST TREATMENT PLAN GOAL Ronald Richard will abstain from all substances and develop a lifestyle of recovery.       The rationale for addressing this problem is that resolving it will (select all that apply):  Reduce symptoms of disorder, Reduce functional impairment associated with disorder, Facilitate transfer skills learned in therapy to everday life and Is a key motivational factor for the patient's participation in treatment      Progress toward goal(s): Problem worsening.  Comment: Ronald Richard's use of alcohol, marijuana, and opiates has increased after struggling to deal with his break up. Due to this, he has been placed on a contract because of concerns of taking his Subxone as prescribed as well as his alcohol intake. Writer will continue to monitor and possible refer to higher level of care and/or explore option of methadone.    1 a. Measurable Objectives : Ronald Richard would like to "be sober" as evidenced by obtaining and maintaining abstinence from all mood altering substances, as discussed in individual and group sessions. .    Date established: 09/14/17   Target date: By Treatment Plan Review Date   Intervention(s): Staff will discuss progress in individual and group sessions, and identify barriers in recovery.    1 b. Measurable Objectives : Ronald Richard would like to "get help by  getting Suboxone" as evidenced by attending his appointments with Ronald Richard and taking medication as prescribed. .    Date established: 09/14/17   Target date: By Treatment Plan Review Date   Intervention(s): Counselor will monitor his attendance and address any barriers if there are any to occur.       Treatment Problem #2 04/11/2017   2ND TREATMENT PLAN PROBLEM Ronald Richard has a history of mental health and is concerned about the impact it has had on his use and relationships.       Treatment Goal #2 04/11/2017   2ND TREATMENT PLAN GOAL Ronald Richard will develop an  awareness of his mental health and the develop skills to cope without using substances.        The rationale for addressing this problem is that resolving it will (select all that apply):  Reduce functional impairment associated with disorder, Facilitate transfer skills learned in therapy to everday life and Is a key motivational factor for the patient's participation in treatment    Progress toward goal(s): Problem worsening.  Comment: Ronald Richard and his girlfriend broke up and he has reported being more depressed and anxious. At one point, he wasn't going to work but has returned back. He wasn't taking his medications properly and decided to go off of his anti-depressants. Writer is working with Ronald Richard to monitor closely and work with him regarding this due to concerns he is self-medicating.    2 a. Measurable Objectives : Ronald Richard would like to "staying positive to not get depressed or angry" as evidenced by identifying his mental health symptoms and verbalizing more constructive means of coping. .    Date established: 09/14/17   Target date: By Treatment Plan Review Date   Intervention(s): Counselor will educate Ronald Richard about symptoms of depression and anger to help her gain an understanding of her own and further process healthier, more constructive means of coping.    2 b. Measurable Objectives : Ronald Richard would like to get further evaluated as evidenced by attending her appointments with the psychiatrist and taking medications as prescribed.    Date established: 09/14/17   Target date: By Treatment Plan Review Date   Intervention(s): Counselor will refer writer to the psychiatrist and follow up with any barriers to her appointments. Writer will encourage her to take medications as prescribed.        ..........................................................................................................................................    Treatment Problem #3 09/13/2017   3RD TREATMENT PLAN PROBLEM Ronald Richard  has limited support from family members.       Treatment Goal #3 09/13/2017   3RD TREATMENT PLAN GOAL Ronald Richard will further engage his family in his recovery.       The rationale for addressing this problem is that resolving it will (select all that apply):  Facilitate transfer skills learned in therapy to everday life    Progress toward goal(s): Ronald Richard and his girlfriend broke up, but prior to that he did tell her about his struggles with addiction. Writer will be completing that goald due to it but did add a goal of identifying how to include his family into his treatment and recovery given their distance    3 a. Measurable Objectives : Ronald Richard will understand his hesitation to tell his girlfirned as evidenced by identifying his feelings and thoughts about including her in about his addiction struggles, as well as the impact this could have on his goal of recovery.               Date established: 06/17/17  Target date: COMPLETED              Intervention(s): Counselor will assist him in exploring his feelings about not disclosing to his girlfriend his struggle with addiction and encourage him to include her in his recovery.    3 b. Measurable Objectives : Iden will engage his family more into his recovery as evidenced by identifying 2-3 ways in which he can include his family into his recovery given their distance..    Date established: 09/14/17   Target date: By Treatment Plan Review Date   Intervention(s): Counselor will assist Hiroshi by helping him identify creative ways to encourage him to include his family into his recovery despite the distance.       Treatment Problem #4 09/13/2017   4TH TREATMENT PLAN PROBLEM Ly is struggling with the break up of his relationship and has a chronic history of substance use impacting them.       Treatment Goal #4 09/13/2017   4TH TREATMENT PLAN GOAL Raghav will develop healthy coping skills to process his break up and further assess his pattern of relationships.        The rationale for addressing this problem is that resolving it will (select all that apply):  Facilitate transfer skills learned in therapy to everday life and Is a key motivational factor for the patient's participation in treatment    Progress toward goal(s): N/A - New goal    4 a. Measurable Objectives : Najae would like to "work through this break up" as evidenced by verbalizing his emotions during sessions and identifying 3 healthy coping mechanisms to assist him in grieving his relationship.    Date established: 09/14/17   Target date: By Treatment Plan Review Date   Intervention(s): Counselor will assist Koichi in individual sessions in exploring and understanding his emotions as he grieves the loss of this relationship, as well as assist in identifying coping mechanisms    _____________________________________________________________________    Plan  TREATMENT MODALITIES:  Individual psychotherapy for 45 min Q 2-3 weeks with Dellia Nims, MA.  [facility] group psychotherapy for 90 min Q twice a  weeks with Dellia Nims, MA.  Buprenorphine prescribed by Billy Fischer, PNP prescription frequency Q once a week weeks frequency of visits Q 1-2 weeks  Primary Care screening yearly.    [facility] PRIMARY CARE SCREEN 03/05/2017   [facility] Primary Care Screen Date 03/05/2017       No flowsheet data found.    Patient was provided with written material on the prevention and treatment of communicable diseases, including viral hepatitis, sexually transmitted diseases and HIV/AIDS,  which includes counseling and education on the use of condoms, testing ,and pre- and post-exposure prophylaxis and treatment.                 Is the patient a veteran? No    DISCHARGE CRITERIA for this treatment setting: Khasir is currently on a contract and if he continues to violate it will be recommended to higher level of care. Sherry could face discharge if he continues to deny recommendations and use substances.    Has the Safety  Plan been created/reviewed for this review period? Yes     Coordination of Care:   PCP: Provider, None  Care Management: N/A  Type of provider(s): N/A  Provider Info (Agency name, contact person/phone number) and Frequency of Contact (weekly, monthly, quarterly, etc.): N/A    Discussion with patient if there is a desire to change advanced directive.  No    Emergency Plan has been reviewed.    Patient/Family Statement  PATIENT/FAMILY STATEMENT:  Obtain patient and family input into the treatment plan, including areas of agreement / disagreement.  Obtain patient's signature - if not possible, briefly describe the reason.     Patient Comments:  None    I HAVE PARTICIPATED IN THE DEVELOPMENT OF THIS TREATMENT PLAN AND I AGREE WITH ITS CONTENTS:       Patient Signature: _______________________________________________________    Date: ______________________________

## 2017-09-13 NOTE — Telephone Encounter (Signed)
Request acknowledged by technician, sent to nursing pool for follow up.     buprenorphine-naloxone (SUBOXONE) 8-2 MG SL film, Place 1 Film under the tongue daily   Max daily dose: 1 Film     [facility] - [place], [place] - [address] [phone] (Phone)  [phone] (Fax)

## 2017-09-14 NOTE — BH Treatment Plan (Signed)
Reviewed by Congetta Odriscoll, RN

## 2017-09-17 ENCOUNTER — Ambulatory Visit: Payer: PRIVATE HEALTH INSURANCE

## 2017-09-17 DIAGNOSIS — F1120 Opioid dependence, uncomplicated: Secondary | ICD-10-CM

## 2017-09-17 NOTE — Progress Notes (Signed)
[facility] Session Note     PSYCH GROUP THERAPY    Is this a group service?:  Yes    Group Name:  [facility]  Group ID:  [id number]  Group Face to Face Duration:  90  Number of Participants:  7      Eligibility for previously-prescribed controlled substance:  The patient is NOT currently eligible to have a monthly prescription for previously-prescribed controlled substance. To support safety and adherence to treatment regimen, the following interval is recommended:  One week supply         Overall Focus/Topic   Chemical Dependency  Relapse Prevention    Goal of Group Session  Today's group discussed healthy relationships and educated the group on the five love languages    Toxicology Screen: no  Last reported/recorded use: 7/25 - marijuana; 7/16 - opiates and alcohol    Patient Participation/Response  Attitude Re Treatment: positive  Provided feedback: yes  Adhered to Group Norms: yes  Expressed Feelings: yes  Maintained Attention: yes  Self-disclosed Relevant Information: yes    Comment  Ronald Richard arrived to group on time and was orientated x3. He reported continuing to abstain all week from substances except for marijuana. He reported taking it day to day and isn't sure what his plans for the weekend were. Writer expressed concern as he has been drinking on weekends and encouraged him to keep him self busy due to drinking alcohol on weekends. He participated in the group discussion and was encouraged to follow up with looking into the five love languages due to some questions about a possible relationship.

## 2017-09-17 NOTE — Progress Notes (Signed)
[facility] Individual Progress Note     Duration:  45 minutes      Contact Type:  Location: On Site    Face to Face     Eligibility for previously-prescribed controlled substance:  The patient is NOT currently eligible to have a monthly prescription for previously-prescribed controlled substance. To support safety and adherence to treatment regimen, the following interval is recommended:  One week supply       Last Toxicology Screen Date: 08/17/17     Last reported/recorded use: 09/04/17 - opiates; 7/20 - alcohol, cocaine; 7/23 - marijuana      Treatment/Recovery Goals Addressed:    Treatment Problem #1 04/11/2017   1ST TREATMENT PLAN PROBLEM Ronald Richard has struggles to abstain from use of substances, which has created negative consequences in his life.     Treatment Goal #1 04/11/2017   1ST TREATMENT PLAN GOAL Ronald Richard will abstain from all substances and develop a lifestyle of recovery.     Treatment Problem #2 04/11/2017   2ND TREATMENT PLAN PROBLEM Ronald Richard has a history of mental health and is concerned about the impact it has had on his use and relationships.     Treatment Goal #2 04/11/2017   2ND TREATMENT PLAN GOAL Ronald Richard will develop an awareness of his mental health and the develop skills to cope without using substances.      Treatment Problem #3 09/13/2017   3RD TREATMENT PLAN PROBLEM Ronald Richard has limited support from family members.     Treatment Goal #3 09/13/2017   3RD TREATMENT PLAN GOAL Ronald Richard will further engage his family in his recovery.       Domestic Violence Screen:  No indication to update domestic violence screen.    Session Content  Treatment plan developed with patient  Treatment plan reviewed with patient  Patient is in agreement with treatment plan       Evidence-based or Clinical Intervention Used:  Cognitive Behavioral Therapy  Motivational Interviewing    Session summary/response to session:   Ronald Richard arrived to his session on time and as orientated x3. He reported doing well today but is busy at  work. He updated Clinical research associatewriter on his use this past week, in which he continues to report no opiate use since the 16th but did report alcohol and cocaine use over the weekend. He updated Clinical research associatewriter about that day, including his ex-girlfriend coming to get her belongings and struggling with his cravings for "4 hours before I caved". He had a couple of drinks at home then a friend called to meet him downtown. His friend had cocaine and he got more. He ended up driving home intoxicated. Writer expressed concern about his decision making and cocaine use. He discussed the correlation between his alcohol use and cocaine.    He reported stress today related to finances. He contacted his ex-girlfriend on whether she closed out an account with money of his she was holding and she became angry with him. He didn't want to tell her his financial situation but needed it. Writer reviewed with him the financial impact continued use is having for each of the substance. He reported typically spending $100 when he uses cocaine and feels he uses a couple of hundred over a month on alcohol when he does drink. He reported not having the finances for his opiates as well and finds this consequences as helping motivate his abstinence.     He further discussed being conflicted as he has been talking to a new girl. It  is someone he knew in high school, who is the person he spent time with last week. He reported being unsure which direction to take it but does want to slow it down. Writer and Bernal discussed giving himself time to grieve his last relationship and his pattern of getting into new relationships before he copes with the previous.     Writer had him sign a copy of his contact today and reminded him of it. He deny any questions or concerns about it.    Plan  Continue Treatment as planned.       Current Treatment Plan   Created/Updated On 09/13/2017   Next Treatment Plan Due 12/12/2017       (Please complete the Domestic Abuse and Neglect  screening question on the Screening Navigator during treatment plan review session)

## 2017-09-18 ENCOUNTER — Ambulatory Visit: Payer: PRIVATE HEALTH INSURANCE

## 2017-09-18 NOTE — Progress Notes (Signed)
[facility] Session Note     PSYCH GROUP THERAPY    Is this a group service?:  Yes    Group Name:  Relapse Prevention  Group ID:  [id number]  Group Face to Face Duration:  60  Number of Participants:  9      Eligibility for previously-prescribed controlled substance:  The patient is NOT currently eligible to have a monthly prescription for previously-prescribed controlled substance. To support safety and adherence to treatment regimen, the following interval is recommended:  One week supply         Overall Focus/Topic   Chemical Dependency  Relapse Prevention    Goal of Group Session  A handout on relapse justification was reviewed and it was discussed how excuses shouldnt be made for relapses.    Toxicology Screen: no  Last reported/recorded use: alcohol over the weekend and marijuana this morning     Patient Participation/Response  Attitude Re Treatment: positive  Provided feedback: yes  Adhered to Group Norms: yes  Expressed Feelings: yes  Maintained Attention: yes  Self-disclosed Relevant Information: yes    Comment  Ronald Richard arrived to group on time and shared how it took him 6 years to complete his associates but was so proud when he did and he encouraged another member who was struggling with furthering his education.

## 2017-09-19 ENCOUNTER — Other Ambulatory Visit: Payer: Self-pay | Admitting: Psychiatry

## 2017-09-19 MED ORDER — BUPRENORPHINE HCL-NALOXONE HCL 8-2 MG SL FILM *I*
1.0000 | ORAL_FILM | Freq: Every day | SUBLINGUAL | 0 refills | Status: DC
Start: 2017-09-19 — End: 2017-09-27

## 2017-09-19 NOTE — Telephone Encounter (Signed)
Request acknowledged by technician, sent to nursing pool for follow up.     buprenorphine-naloxone (SUBOXONE) 8-2 MG SL film 7 Film 0 09/13/2017     Sig - Route: Place 1 Film under the tongue daily  Max daily dose: 1 Film - Sublingual    Sent to pharmacy as: buprenorphine-naloxone (SUBOXONE) 8-2 MG SL film      [facility] [place] - [id number]

## 2017-09-20 ENCOUNTER — Ambulatory Visit: Payer: PRIVATE HEALTH INSURANCE | Attending: Psychiatry

## 2017-09-20 ENCOUNTER — Other Ambulatory Visit
Admission: RE | Admit: 2017-09-20 | Discharge: 2017-09-20 | Disposition: A | Discharge status: Home / Self Care | Payer: PRIVATE HEALTH INSURANCE | Source: Ambulatory Visit | Attending: Psychiatry | Admitting: Psychiatry

## 2017-09-20 DIAGNOSIS — F329 Major depressive disorder, single episode, unspecified: Secondary | ICD-10-CM | POA: Insufficient documentation

## 2017-09-20 DIAGNOSIS — F1920 Other psychoactive substance dependence, uncomplicated: Secondary | ICD-10-CM | POA: Insufficient documentation

## 2017-09-20 DIAGNOSIS — F1120 Opioid dependence, uncomplicated: Secondary | ICD-10-CM | POA: Insufficient documentation

## 2017-09-20 DIAGNOSIS — F1220 Cannabis dependence, uncomplicated: Secondary | ICD-10-CM | POA: Insufficient documentation

## 2017-09-20 DIAGNOSIS — F419 Anxiety disorder, unspecified: Secondary | ICD-10-CM | POA: Insufficient documentation

## 2017-09-20 DIAGNOSIS — F1020 Alcohol dependence, uncomplicated: Secondary | ICD-10-CM | POA: Insufficient documentation

## 2017-09-21 LAB — ETHYL GLUCURONIDE, UR: Ethyl glucuronide, Ur: NEGATIVE

## 2017-09-21 LAB — CHEM DEPEND + ETOH SCRN, URINE
Amphetamine,UR: NEGATIVE
Benzodiazepinen,UR: NEGATIVE
Cocaine/Metab,UR: NEGATIVE
Ethanol,UR: NEGATIVE
Opiates,UR: NEGATIVE
Oxycodone/Oxymorphone,UR: NEGATIVE
THC Metabolite,UR: POSITIVE

## 2017-09-23 NOTE — Progress Notes (Signed)
[facility] Session Note     PSYCH GROUP THERAPY    Is this a group service?:  Yes    Group Name:  Early Recovery  Group ID:  [id number]  Group Face to Face Duration:  90  Number of Participants:  6      Eligibility for previously-prescribed controlled substance:  The patient is NOT currently eligible to have a monthly prescription for previously-prescribed controlled substance. To support safety and adherence to treatment regimen, the following interval is recommended:  One week supply         Overall Focus/Topic   Chemical Dependency  Relapse Prevention    Goal of Group Session  Today's group watched a TedTalk and discussed the importance of sober activities and supports for recovery.    Toxicology Screen: no  Last reported/recorded use: 7/31 - marijuana; 7/16 - opiates and alcohol    Patient Participation/Response  Attitude Re Treatment: positive  Provided feedback: yes  Adhered to Group Norms: yes  Expressed Feelings: yes  Maintained Attention: yes  Self-disclosed Relevant Information: yes    Comment  Ronald Richard arrived to group on time and was orientated x3. He reported plans to go camping this weekend yet was going by the race. Writer expressed concern due to him being around a party scene and not surrounding himself in a sober environment. He further watched the video and was encouraged to take into consideration the video and his recovery due to his lack of sober supports and activities.

## 2017-09-24 ENCOUNTER — Ambulatory Visit: Payer: PRIVATE HEALTH INSURANCE

## 2017-09-24 DIAGNOSIS — F1120 Opioid dependence, uncomplicated: Secondary | ICD-10-CM

## 2017-09-24 LAB — THC SEMI-QUANT, URINE
Semi-Quant THC,UR: 1275 ng/mL
THC/Creat Ratio: 1342 ng/mg

## 2017-09-25 ENCOUNTER — Ambulatory Visit: Payer: PRIVATE HEALTH INSURANCE

## 2017-09-26 ENCOUNTER — Ambulatory Visit: Payer: PRIVATE HEALTH INSURANCE

## 2017-09-26 DIAGNOSIS — F1120 Opioid dependence, uncomplicated: Secondary | ICD-10-CM

## 2017-09-26 LAB — BUPRENORPHINE, UR QNT
BUPREN GLUC,UR QNT: 59 ng/mL
BUPRENORPHINE,UR QNT: 4 ng/mL
Creatinine,UR: 95 mg/dL (ref 20–300)
NORBUPREN GLUC,UR QNT: 95 ng/mL
NORBUPRENORPHINE,UR QNT: 21 ng/mL
TOTAL BUP/CREAT, UR QNT: 49 ng/mg
TOTAL BUPREN,UR QNT: 47 ng/mL
TOTAL NORBUP/CREAT,UR QNT: 93 ng/mg
TOTAL NORBUPREN,UR QNT: 88 ng/mL

## 2017-09-26 NOTE — Progress Notes (Signed)
[facility] Session Note     PSYCH GROUP THERAPY    Is this a group service?:  Yes    Group Name:  Relapse Prevention  Group ID:  [id number]  Group Face to Face Duration:  60  Number of Participants:  12      Eligibility for previously-prescribed controlled substance:  The patient is NOT currently eligible to have a monthly prescription for previously-prescribed controlled substance. To support safety and adherence to treatment regimen, the following interval is recommended:  One week supply         Overall Focus/Topic   Chemical Dependency  Relapse Prevention    Goal of Group Session  Today's group discussed values and what they each identify as their top 10 values. They discussed how their values change and how their use impacted it. They further identified how values can help them identify sober supports and activities.     Toxicology Screen: no  Last reported/recorded use: 8/4 - marijuana; 8/3 - alcohol, cocaine     Patient Participation/Response  Attitude Re Treatment: positive  Provided feedback: yes  Adhered to Group Norms: yes  Expressed Feelings: yes  Maintained Attention: yes  Self-disclosed Relevant Information: yes    Comment  Ronald Richard arrived to group on time and was orientated x3. He reported drinking and doing cocaine over the weekend. He denied it being due to camping near the race this weekend, but instead "I made the decision to bring it before I got down there". He further participated in the group discussion regarding values and was encouraged to see how they can assist him in his recovery.

## 2017-09-27 ENCOUNTER — Other Ambulatory Visit: Payer: Self-pay | Admitting: Psychiatry

## 2017-09-27 ENCOUNTER — Ambulatory Visit: Payer: PRIVATE HEALTH INSURANCE

## 2017-09-27 DIAGNOSIS — F419 Anxiety disorder, unspecified: Secondary | ICD-10-CM

## 2017-09-27 DIAGNOSIS — F1220 Cannabis dependence, uncomplicated: Secondary | ICD-10-CM

## 2017-09-27 DIAGNOSIS — F1020 Alcohol dependence, uncomplicated: Secondary | ICD-10-CM

## 2017-09-27 DIAGNOSIS — F1120 Opioid dependence, uncomplicated: Secondary | ICD-10-CM

## 2017-09-27 DIAGNOSIS — F32A Depression, unspecified: Secondary | ICD-10-CM

## 2017-09-27 DIAGNOSIS — Z639 Problem related to primary support group, unspecified: Secondary | ICD-10-CM

## 2017-09-27 DIAGNOSIS — F1410 Cocaine abuse, uncomplicated: Secondary | ICD-10-CM

## 2017-09-27 MED ORDER — BUPRENORPHINE HCL-NALOXONE HCL 8-2 MG SL FILM *I*
1.0000 | ORAL_FILM | Freq: Every day | SUBLINGUAL | 0 refills | Status: DC
Start: 2017-09-27 — End: 2017-10-16

## 2017-09-27 NOTE — Telephone Encounter (Signed)
Request acknowledged by technician, sent to nursing pool for follow up.     buprenorphine-naloxone (SUBOXONE) 8-2 MG SL film, Place 1 Film under the tongue daily   Max daily dose: 1 Film     [facility] - [place], [place] - [address] [phone] (Phone)  [phone] (Fax)

## 2017-09-27 NOTE — Progress Notes (Signed)
[facility] Referral Note     Clinician and program currently engaged in: Relapse Prevention [facility]  Referral discussed between clinician and Peer and Family Services: Yes    Reason for referral: Ronald Richard could benefit from a connection to community resources as well as the services offered through a Peer Recovery Specialist.     Discussion completed with patient regarding referral: Yes    DIAGNOSIS:    ICD-10-CM ICD-9-CM   1. Opioid use disorder, severe, dependence F11.20 304.00   2. Cannabis use disorder, moderate, dependence F12.20 304.30   3. Alcohol use disorder, severe, dependence F10.20 303.90   4. Cocaine use disorder, mild, abuse F14.10 305.60   5. Anxiety F41.9 300.00   6. Depression, unspecified depression type F32.9 311   7. Relationship problems Z63.9 313.3       OTHER ACTIVE PARTICIPANTS IN PATIENT'S CARE AND SUPPORT SYSTEM (other than mental health providers): Community Provider: Billy FischerEve Andersen-Buescher, NP    Is there a release of information present for any of the above providers? N/A

## 2017-09-28 NOTE — Progress Notes (Signed)
[facility] Individual Progress Note     Duration:  45 minutes      Contact Type:  Location: On Site    Face to Face     Eligibility for previously-prescribed controlled substance:  The patient is NOT currently eligible to have a monthly prescription for previously-prescribed controlled substance. To support safety and adherence to treatment regimen, the following interval is recommended:  One week supply       Last Toxicology Screen Date: 08/17/17     Last reported/recorded use: 09/04/17 - opiates; 8/3- alcohol, cocaine; 8/6- marijuana      Treatment/Recovery Goals Addressed:    Treatment Problem #1 04/11/2017   1ST TREATMENT PLAN PROBLEM Ronald Richard has struggles to abstain from use of substances, which has created negative consequences in his life.     Treatment Goal #1 04/11/2017   1ST TREATMENT PLAN GOAL Ronald Richard will abstain from all substances and develop a lifestyle of recovery.     Treatment Problem #2 04/11/2017   2ND TREATMENT PLAN PROBLEM Ronald Richard has a history of mental health and is concerned about the impact it has had on his use and relationships.     Treatment Goal #2 04/11/2017   2ND TREATMENT PLAN GOAL Ronald Richard will develop an awareness of his mental health and the develop skills to cope without using substances.      Treatment Problem #3 09/13/2017   3RD TREATMENT PLAN PROBLEM Ronald Richard has limited support from family members.     Treatment Goal #3 09/13/2017   3RD TREATMENT PLAN GOAL Ronald Richard will further engage his family in his recovery.       Domestic Violence Screen:  No indication to update domestic violence screen.    Session Content  Treatment plan developed with patient  Treatment plan reviewed with patient  Patient is in agreement with treatment plan       Evidence-based or Clinical Intervention Used:  Cognitive Behavioral Therapy  Motivational Interviewing    Session summary/response to session:   Ronald Richard arrived to his session on time and as orientated x3. H reported feeling "just ok" today. He apologized  for missing group yesterday as he reported having to pick up a friend from the airport. This friend is the girl he is speaking to from [facility]. Writer reminded him of his contact and concern he continues to violate it. He also reported drinking alcohol and doing cocaine when he went camping on Saturday. He reported camping by a [place] race was not a trigger but instead "I made the decision before to do it and bring it with me".     When discussing his continued use, he reported struggling with motivation to stop his use. He reported feeling as if "I don't deserve it". He reported feeling as if he was punishing himself for decisions he made when he was 29-14 year old. Before disclosing it further, he reported feeling as if he could never "forgive myself", which Clinical research associate and patient discussed how not processing this could hinder his ability to accomplish his goals. He further then disclosed that he was struggling during that time with his parents divorce and began using more substances then, but also got a girl pregnant and never took responsibility for it. She ended up having the child and he has never been a part of the child's life. He discussed his thoughts about this and his day to day regret regarding this. He reported considering a few years ago "stepping up" but his father discouraged him due to  not wanting him to have to take on the responsibilities. Writer and Ronald Richard further discussed how to process this and the impact it could have on his recovery and mental health if he continues to surpass his thoughts and punish himself.     Writer discussed with him meeting with the Peer Recovery Specialist, Ronald Richard, to further discuss forgiving himself and recovery. He was agreeable regarding it and did verbalize that he felt he could benefit from getting her perspective. Writer encouraged to follow up with this and reminded him of his contract as well. He identified sober activities for the upcoming weekend as well.        Plan  Continue Treatment as planned.       Current Treatment Plan   Created/Updated On 09/13/2017   Next Treatment Plan Due 12/12/2017       (Please complete the Domestic Abuse and Neglect screening question on the Screening Navigator during treatment plan review session)

## 2017-09-29 NOTE — Progress Notes (Signed)
[facility] Group Session Note     PSYCH GROUP THERAPY    Is this a group service?:  Yes    Group Name:  Early Recovery  Group ID:  [id number]  Group Face to Face Duration:  90  Number of Participants:  7      Eligibility for previously-prescribed controlled substance:  The patient is NOT currently eligible to have a monthly prescription for previously-prescribed controlled substance. To support safety and adherence to treatment regimen, the following interval is recommended:  One week supply         Overall Focus/Topic   Chemical Dependency  Relapse Prevention    Goal of Group Session  Today's group discussed discussed three different boundary styles: rigid, permeable, and semi-permeable. The group related different aspects of their lives (physical, emotional, sexual, spiritual...etc) to these boundary styles.    Toxicology Screen: no  Last reported/recorded use: 8/7 - marijuana; 8/3 - opiates and alcohol    Patient Participation/Response  Attitude Re Treatment: positive  Provided feedback: yes  Adhered to Group Norms: yes  Expressed Feelings: yes  Maintained Attention: yes  Self-disclosed Relevant Information: yes    Comment  [name] arrived to group on time and was orientated x3. He reported feeling well today and had plans to look at a few apartments. He was encouraged to consider the area he is in and whether or not it would be close to bars or areas he could have easy access. He further participated in the group discussion and how he feels in some aspects he is rigid but in relationships permeable.

## 2017-10-01 ENCOUNTER — Telehealth: Payer: Self-pay

## 2017-10-01 ENCOUNTER — Ambulatory Visit: Payer: PRIVATE HEALTH INSURANCE

## 2017-10-01 NOTE — Telephone Encounter (Signed)
Writer attempted to reach [name] due to his NCNS for group. Writer left a message requesting a call back.

## 2017-10-02 ENCOUNTER — Ambulatory Visit: Payer: PRIVATE HEALTH INSURANCE

## 2017-10-02 DIAGNOSIS — F1120 Opioid dependence, uncomplicated: Secondary | ICD-10-CM

## 2017-10-03 NOTE — Progress Notes (Signed)
[facility] Session Note     PSYCH GROUP THERAPY    Is this a group service?:  Yes    Group Name:  Early Recovery  Group ID:  [id number]  Group Face to Face Duration:  75  Number of Participants:  6      Eligibility for previously-prescribed controlled substance:  The patient is NOT currently eligible to have a monthly prescription for previously-prescribed controlled substance. To support safety and adherence to treatment regimen, the following interval is recommended:  One week supply         Overall Focus/Topic   Chemical Dependency  Relapse Prevention    Goal of Group Session  Today's group discussed employment stressors. The group also took time in group to fill out the [facility] assessment.     Toxicology Screen: no  Last reported/recorded use: 8/12 - marijuana; 8/10 - alcohol    Patient Participation/Response  Attitude Re Treatment: positive  Provided feedback: yes  Adhered to Group Norms: yes  Expressed Feelings: yes  Maintained Attention: yes  Self-disclosed Relevant Information: yes    Comment  [name] arrived to group on time and was orientated x3.  He reported being stressed today as he is struggling with trying to figure out housing. He does have one he wants but is unsure due to them doing background checks. He asked questions about charges out of state and whether they would show. He further participated in he group discussion regarding work stress and gave feedback. He also filled out his BAM assessment.

## 2017-10-04 ENCOUNTER — Ambulatory Visit: Payer: PRIVATE HEALTH INSURANCE | Admitting: Psychiatry

## 2017-10-04 ENCOUNTER — Ambulatory Visit: Payer: PRIVATE HEALTH INSURANCE

## 2017-10-04 VITALS — BP 141/84 | HR 64 | Ht 72.0 in | Wt 277.0 lb

## 2017-10-04 DIAGNOSIS — F1220 Cannabis dependence, uncomplicated: Secondary | ICD-10-CM

## 2017-10-04 DIAGNOSIS — F1120 Opioid dependence, uncomplicated: Secondary | ICD-10-CM

## 2017-10-04 DIAGNOSIS — F1020 Alcohol dependence, uncomplicated: Secondary | ICD-10-CM

## 2017-10-04 DIAGNOSIS — F419 Anxiety disorder, unspecified: Secondary | ICD-10-CM

## 2017-10-04 NOTE — Progress Notes (Addendum)
[facility] EVALUATION AND MANAGEMENT         HISTORY     Chief Complaint/Reason for Encounter: suboxone follow up, depressed mood    History of Presenting Illness: Patient reports beginning to use opiates at age 32 and reports that ' there is not much that he has not tried' in his past; has had no previous chemical dependency treatment, has struggled with depression and anxiety.  Feels that his main issues are opiates ( oxy) and marijuana, has a h/o binge drinking which he feels he is managing as he has managed to decrease his use over the past year.  Denies any h/o seizures, does report symptoms of withdrawal from alcohol.    On contact today states that he is stressed, he needs to get back to a meeting a work, is feeling irritable and overwhelmed at times.  Taking suboxone, using cannabis daily.  Does not want to be 'dependent on a pill' for his mood.     Past, Family, Social History:  [place], working full time as a Comptrollermechanical engineer at [employer]       Past Psychiatric History:     Bipolar Depression    Psychiatric Hospitalizations: denies     Psychiatric Outpatient: yes    [place] in [place] - last saw 2015   [facility]    CD Treatment:denies    Review of Past Psychiatric Med Trials:    seroquel - helped me sleep   lamictal - not sure if it worked  - was still using other drugs and drinking heavily    Family Psychiatric & Substance Use History:   Maternal Uncle - ' heavy alcoholic'   Youngest brother - marijuana    Review of Systems & Active Medical Problems:  (1 system = Problem Pertinent; 2-9 systems = Extended; 10 or more systems or some systems noted as "all others negative"= Complete)    See pertinent info as in HPI.     Current Medication Side Effects: denies    Last Alcohol & Drug Use:    Alcohol: 09/29/17   Nicotine: denis  Cannabis: 10/04/17  Cocaine: 03/17/17  Opiates: 09/04/17 -  Synthetic  Cannabinoids: denies  Synthetic Stimulants ("Bath Salts"): denies  Non-prescribed Benzodiazepines: 05/2016 - xanax  Mushrooms:2017  Acid/LSD:' twice in my life'  Ecstasy:age 14 - 21 ' very heavily'  Molly:  " used it maybe 5 times in my life  Meth: 2016  Adderall: 2016 - don't like, make me anxious    Longest sobriety: 1 1/2 years  - age 32     Buprenorphine Follow Up:    Cravings: none  Side Effects: none  Toxicology:09/20/17  - (+) THC  Films Remaining: 0    Checked [place] database today. No unexpected prescriptions were found in this database.     PHQ 02/27/2017   PHQ-9 Total Score 6       New Medical Hx: denies        PSYCHIATRIC  EXAMINATION   (1-5 bullets = Problem Focused; 6-8 bullets = Expanded Problem Focused; At least 9 bullets = Detailed; Completion of All bulleted items - Comprehensive). Note: Vital Signs are weighted as one system area)    BP 141/84    Pulse 64    Ht 1.829 m (6')    Wt 125.6 kg (277 lb)    BMI 37.57 kg/m     Appearance:  Casual  Manner:  Defensive, anxious  Musculoskeletal:  WNL -  Speech:  Normal rate, Normal volume, southern accent  Thought Process:  Normal  Description of Associations:  Goal Directed, Logical   Description of Abnormal or Psychotic Thoughts (Includes Safety Risk):  Thought Content: No unusual themes  Perception: No evidence of hallucinations   Safety Risk: Suicidal Ideation: Patient Denies, Homicidal Ideation: Patient Denies  Description of Judgment and Insight:  Intact judgment  Good insight  Orientation:  Alert and Oriented X 3.  Memory (Recent/Remote):  intact  Attention/Concentration:  WNL  Language:  No evidence of impairment in language, comprehension, or expression noted on today's exam.  Fund of Knowledge:  Intact  Mood and Affect  sad  mood  Sad, anxious affect    ASSESSMENT OF RISK FOR SUICIDAL BEHAVIOR  Changes in risk for suicide from baseline Formulation of Risk and/or previous intake, including newly identified risk, if any: none    ASSESSMENT OF RISK FOR  VIOLENT BEHAVIOR  Changes in risk for violence from baseline Formulation of Risk and/or previous intake, including newly identified risk, if any: none      MEDICAL DECISION MAKING     Diagnoses:    ICD-10-CM ICD-9-CM   1. Opioid use disorder, severe, dependence F11.20 304.00   2. Cannabis use disorder, moderate, dependence F12.20 304.30   3. Alcohol use disorder, severe, dependence F10.20 303.90   4. Anxiety F41.9 300.00       Formulation:  Ronald Richard is a 32 y.o. Caucasian male with polysubstance use disorder, most notably opiates, cannabis and alcohol, who presents today for follow up after suboxone induction 03/27/17. Sad and anxious on contact today, states that he has a meeting to get to back at work, feeling overwhelmed at times with all he needs to do.  Continues intermittent use including cannabis on a daily basis and reflected back to him that it seemed a contraindication that he would not take an SSRI for his depression or anxiety due to not wanting to be ' dependent' on a pill yet is using cannabis every day to manage his symptoms - minimally receptive to this.  Screens reflect limited use of suboxone which we also discussed - encouraged to come to next session with writer with a plan as to how he wants to move forward as when asked this today he was unable to come up with one.  Denies SI/HI/AH/VH, denies any safety concerns.     Medications/Medical Records/Labs/Diagnostic Tests Reviewed:  Current Outpatient Prescriptions   Medication Sig    buprenorphine-naloxone (SUBOXONE) 8-2 MG SL film Place 1 Film under the tongue daily   Max daily dose: 1 Film    cloNIDine (CATAPRES) 0.1 MG tablet Take 1 tablet (0.1 mg total) by mouth nightly    docusate sodium (COLACE) 100 MG capsule Take 1 capsule (100 mg total) by mouth 2 times daily       Counseling/Coordination of Care:  [x]  Diagnostic results/impressions and/or recommended studies were discussed   [x]  Instruction for management/treatment/followup  discussed  [x]  Risks and benefits of treatment options discussed  [x]  Education provided to patient/family/caregiver  []  Side effects and benefits of his medications were discussed  []  Side effect management discussed  [x]  Adjustments of medication timing discussed  []  Risk factor reduction discussed  []  Prognosis discussed  [x]  Importance of adherence to treatment regimen discussed  []  Nicotine dependence and quitting were discussed.  []  Relapse prevention discussed    Plan:  Continue: Suboxone:  8 mg  daily SL - weekly scripts                  Clonidine 0.1 mg once a day                Return visit: 2 weeks     Duration:  20 minutes  Greater than 50 % of face to face time was spent in counseling/coordination of care

## 2017-10-05 ENCOUNTER — Ambulatory Visit: Payer: PRIVATE HEALTH INSURANCE | Admitting: Psychiatry

## 2017-10-08 ENCOUNTER — Ambulatory Visit: Payer: PRIVATE HEALTH INSURANCE

## 2017-10-08 DIAGNOSIS — F1220 Cannabis dependence, uncomplicated: Secondary | ICD-10-CM

## 2017-10-09 ENCOUNTER — Other Ambulatory Visit
Admission: RE | Admit: 2017-10-09 | Discharge: 2017-10-09 | Disposition: A | Discharge status: Home / Self Care | Payer: PRIVATE HEALTH INSURANCE | Source: Ambulatory Visit | Attending: Psychiatry | Admitting: Psychiatry

## 2017-10-09 ENCOUNTER — Ambulatory Visit: Payer: PRIVATE HEALTH INSURANCE

## 2017-10-09 DIAGNOSIS — F1220 Cannabis dependence, uncomplicated: Secondary | ICD-10-CM

## 2017-10-09 DIAGNOSIS — Z0189 Encounter for other specified special examinations: Secondary | ICD-10-CM | POA: Insufficient documentation
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
## 2017-10-10 ENCOUNTER — Ambulatory Visit: Payer: PRIVATE HEALTH INSURANCE

## 2017-10-10 LAB — CHEM DEPEND + ETOH SCRN, URINE
Amphetamine,UR: NEGATIVE
Benzodiazepinen,UR: NEGATIVE
Cocaine/Metab,UR: NEGATIVE
Ethanol,UR: NEGATIVE
Opiates,UR: NEGATIVE
Oxycodone/Oxymorphone,UR: NEGATIVE
THC Metabolite,UR: POSITIVE

## 2017-10-10 LAB — ETHYL GLUCURONIDE, UR: Ethyl glucuronide, Ur: NEGATIVE
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
## 2017-10-10 NOTE — Progress Notes (Signed)
[facility] Group Session Note     PSYCH GROUP THERAPY    Is this a group service?:  Yes    Group Name:  Early Recovery  Group ID:  [id number]  Group Face to Face Duration:  90  Number of Participants:  10      Eligibility for previously-prescribed controlled substance:  The patient is NOT currently eligible to have a monthly prescription for previously-prescribed controlled substance. To support safety and adherence to treatment regimen, the following interval is recommended:  One week supply         Overall Focus/Topic   Chemical Dependency  Relapse Prevention    Goal of Group Session  Today's group discussed stress management due to the various stressors group members are dealing with. The group discussed how to mentally process their stress as well as ways to decrease stress and cope.    Toxicology Screen: no  Last reported/recorded use: 8/17 - marijuana alcohol    Patient Participation/Response  Attitude Re Treatment: positive  Provided feedback: yes  Adhered to Group Norms: yes  Expressed Feelings: yes  Maintained Attention: yes  Self-disclosed Relevant Information: yes    Comment  [name] arrived to group on time and was orientated x3. He discussed with the group his weekend and what occurred while he was intoxicated. He got advice from the group on how to process his feelings of guilt and negative self talk. He was encouraged to distinguish the difference between his addiction and who he is as a person. He further participated in the group discussion regarding managing stress, including his recent stressors.

## 2017-10-10 NOTE — Care Coordination Plan (Signed)
[facility] Mental Health Embedded Therapist Referral Note     Clinician and program currently engaged in: Individual sessions with [name], Relapse Prevention Group, [facility], and recent referral to Peer Recovery Specialist  Referral discussed between clinician and embedded Mental Health Therapist    [facility] Admission Date: 03/21/17  Current mental health symptoms: Depressed, Anxiety, lack of motivation, self-medicating with substances  Are there any safety concerns?: Denied any thoughts of suicidal ideations or self harm  Date of most recent safety plan: 02/27/17 - Clinical research associatewriter will provide an update at next session  Primary substance and last positive toxicology screen: Opiates; More recently struggling with alcohol; 8/1 - positive for THC; 8/20 - pending  Most recent mental health treatment episode; where and are they still enrolled?: brief history when living in [place] in his early 20's      Reason for referral: [name] has been reporting an increase of depression and anxiety symptoms. He reports a history of being diagnosed with Bipolar Disorder in [place] but is unsure of the specifics on when. He was prescribed medications by Billy FischerEve Andersen-Buescher but stopped taking them. Around that same time him and his girlfriend broke up, in which he reported an increase of symptoms and began increasing his use of alcohol and marijuana. He also at the time time wasn't taking his Suboxone as prescribed to be able to take opiates. He is tearful during individual sessions and has reported struggling with significant guilt and self-hatred due to decisions he has made when he was younger and while under the influence, as well as the loss of his relationship. He has reported recently to Clinical research associatewriter that he feels he is punishing himself with using and self-sabotaging his recovery efforts due to not believing he deserves it.     Discussion completed with patient regarding referral: Yes  Discussion completed in treatment team meeting: No:   Writer did speak to Billy FischerEve Andersen-Buescher, NP about the referral as well.    DIAGNOSIS:     ICD-10-CM ICD-9-CM   1. Cannabis use disorder, moderate, dependence F12.20 304.30

## 2017-10-11 ENCOUNTER — Ambulatory Visit: Payer: PRIVATE HEALTH INSURANCE

## 2017-10-11 DIAGNOSIS — F1220 Cannabis dependence, uncomplicated: Secondary | ICD-10-CM

## 2017-10-11 LAB — THC SEMI-QUANT, URINE
Semi-Quant THC,UR: 960 ng/mL
THC/Creat Ratio: 298 ng/mg

## 2017-10-13 LAB — BUPRENORPHINE, UR QNT
BUPREN GLUC,UR QNT: 478 ng/mL
BUPRENORPHINE,UR QNT: 29 ng/mL
Creatinine,UR: 322 mg/dL — ABNORMAL HIGH (ref 20–300)
NORBUPREN GLUC,UR QNT: 489 ng/mL
NORBUPRENORPHINE,UR QNT: 114 ng/mL
TOTAL BUP/CREAT, UR QNT: 117 ng/mg
TOTAL BUPREN,UR QNT: 376 ng/mL
TOTAL NORBUP/CREAT,UR QNT: 142 ng/mg
TOTAL NORBUPREN,UR QNT: 457 ng/mL

## 2017-10-15 ENCOUNTER — Ambulatory Visit: Payer: PRIVATE HEALTH INSURANCE

## 2017-10-15 ENCOUNTER — Ambulatory Visit: Payer: PRIVATE HEALTH INSURANCE | Admitting: Psychiatry

## 2017-10-15 DIAGNOSIS — F1220 Cannabis dependence, uncomplicated: Secondary | ICD-10-CM

## 2017-10-15 NOTE — Care Coordination Plan (Signed)
[facility] Peer and Family Advocate Note     Patient Name: Geanie KenningJustin Maeder  MRN: [id number]  Encounter Date: 10/15/17    Duration:  30 minutes      Contact Type:  Location: On Site    Direct, Face to Face     Primary Therapist: Thersa Salttara shawtra  Case Manager: na     Reason for Referral:  Patient would like information about community resources    Session Content:  Patient reports feeling isolated at times due to knowing anyone from [place]. ([place] from [place]). Patient would like to engage in community supports in order to strengthen recovery and build a social network. Pt was interested on [facility] and Clinical research associatewriter provided pt with schedule and a breif overveiw of what rocfit is and does. Pt also stated that he used to attend meeting in [place] but has not since moving up here. Writer sugested checking out meetings in the area to see what he thought of them. Writer also gave pt info about [facility] as pt reports liking meeting but had problems with "the whole higher power thing"    Plan/Progress Toward Achieving Referral Needs:  Pt agreed to attend 1 [facility] activity and 1 meeting before follow up appointent on 9/9    Guinevere FerrariKelly Cornellius Kropp

## 2017-10-15 NOTE — Progress Notes (Signed)
[facility] Group Session Note     PSYCH GROUP THERAPY    Is this a group service?:  Yes    Group Name:  Early Recovery  Group ID:  [id number]  Group Face to Face Duration:  90  Number of Participants:  8      Eligibility for previously-prescribed controlled substance:  The patient is NOT currently eligible to have a monthly prescription for previously-prescribed controlled substance. To support safety and adherence to treatment regimen, the following interval is recommended:  One week supply         Overall Focus/Topic   Chemical Dependency  Relapse Prevention    Goal of Group Session  Today's group discussed the poem "The Guy in the Glass".     Toxicology Screen: no  Last reported/recorded use: 8/17 - marijuana alcohol    Patient Participation/Response  Attitude Re Treatment: positive  Provided feedback: yes  Adhered to Group Norms: yes  Expressed Feelings: yes  Maintained Attention: yes  Self-disclosed Relevant Information: yes    Comment  [name] arrived to group on time and was orientated x3. He discussed stress as he needs to start packing and get ready to move. He is picking up a baseball game this weekend though. He further participated in the group discussion regarding the poem and his struggle with looking at himself. He reported struggling with forgetting himself and negative thoughts.

## 2017-10-16 ENCOUNTER — Ambulatory Visit: Payer: PRIVATE HEALTH INSURANCE

## 2017-10-16 ENCOUNTER — Other Ambulatory Visit: Payer: Self-pay | Admitting: Psychiatry

## 2017-10-16 MED ORDER — BUPRENORPHINE HCL-NALOXONE HCL 8-2 MG SL FILM *I*
1.0000 | ORAL_FILM | Freq: Every day | SUBLINGUAL | 0 refills | Status: DC
Start: 2017-10-16 — End: 2017-10-25

## 2017-10-16 NOTE — Telephone Encounter (Signed)
Request acknowledged by technician, sent to nursing pool for follow up.     buprenorphine-naloxone (SUBOXONE) 8-2 MG SL film, Place 1 Film under the tongue daily   Max daily dose: 1 Film     [facility] - [place] - [address] [phone] (Phone)  [phone] (Fax)

## 2017-10-17 NOTE — Progress Notes (Signed)
[facility] Group Session Note     PSYCH GROUP THERAPY    Is this a group service?:  Yes    Group Name:  Relapse Prevention  Group ID:  [id number]  Group Face to Face Duration:  60  Number of Participants:  10      Eligibility for previously-prescribed controlled substance:  The patient is NOT currently eligible to have a monthly prescription for previously-prescribed controlled substance. To support safety and adherence to treatment regimen, the following interval is recommended:  One week supply         Overall Focus/Topic   Chemical Dependency  Relapse Prevention    Goal of Group Session  Todays group discussed social supports. Reviewed local resources and meetings.     Toxicology Screen: no     Last reported/recorded use: 8/17 - alcohol; 8/24 - opiate and marijuana    Patient Participation/Response  Attitude Re Treatment: positive  Provided feedback: yes  Adhered to Group Norms: yes  Expressed Feelings: yes  Maintained Attention: yes  Self-disclosed Relevant Information: yes    Comment  [name] arrived to group on time and was orientated x3. He reported a use over the weekend. He further reported packing and getting ready to move. He has found it stressful but will be moved by the end of the week. He further participated in the group discussion and was encouraged to seek out more positive supports, including [facility].

## 2017-10-18 ENCOUNTER — Telehealth: Payer: Self-pay

## 2017-10-18 ENCOUNTER — Ambulatory Visit: Payer: PRIVATE HEALTH INSURANCE

## 2017-10-18 NOTE — Telephone Encounter (Signed)
Writer attempted to reach Ronald Richard after his [place] for group. Writer was unable to reach him and left a voicemail requesting a call back.

## 2017-10-23 ENCOUNTER — Ambulatory Visit: Payer: PRIVATE HEALTH INSURANCE

## 2017-10-23 ENCOUNTER — Telehealth: Payer: Self-pay

## 2017-10-23 NOTE — Telephone Encounter (Signed)
error 

## 2017-10-25 ENCOUNTER — Ambulatory Visit: Payer: PRIVATE HEALTH INSURANCE | Admitting: Psychiatry

## 2017-10-25 ENCOUNTER — Ambulatory Visit: Payer: PRIVATE HEALTH INSURANCE | Attending: Psychiatry | Admitting: Psychiatry

## 2017-10-25 VITALS — BP 132/67 | HR 58 | Ht 72.0 in | Wt 263.0 lb

## 2017-10-25 DIAGNOSIS — F1020 Alcohol dependence, uncomplicated: Secondary | ICD-10-CM

## 2017-10-25 DIAGNOSIS — F1220 Cannabis dependence, uncomplicated: Secondary | ICD-10-CM | POA: Insufficient documentation

## 2017-10-25 DIAGNOSIS — F1120 Opioid dependence, uncomplicated: Secondary | ICD-10-CM

## 2017-10-25 DIAGNOSIS — F419 Anxiety disorder, unspecified: Secondary | ICD-10-CM

## 2017-10-25 DIAGNOSIS — F329 Major depressive disorder, single episode, unspecified: Secondary | ICD-10-CM | POA: Insufficient documentation

## 2017-10-25 MED ORDER — BUPRENORPHINE HCL-NALOXONE HCL 8-2 MG SL FILM *I*
1.0000 | ORAL_FILM | Freq: Every day | SUBLINGUAL | 0 refills | Status: AC
Start: 2017-10-25 — End: ?

## 2017-10-25 NOTE — Progress Notes (Addendum)
[facility] Group Session Note     PSYCH GROUP THERAPY    Is this a group service?:  Yes    Group Name:  Early Recovery  Group ID:  [id number]  Group Face to Face Duration:  90  Number of Participants:  6      Overall Focus/Topic   Chemical Dependency  Life Skills  Negative thought patterns    Goal of Group Session  Participants viewed "The [employer]fly [employer]" video and discussed negative thought patterns.     Toxicology Screen: no  Last reported/recorded use: 10/12/2017    Patient Participation/Response  Attitude Re Treatment: positive  Provided feedback: yes  Adhered to Group Norms: yes  Expressed Feelings: yes  Maintained Attention: yes  Self-disclosed Relevant Information: yes    Comment  Ronald Richard arrives to group on time and oriented X3. Ronald Richard shares that he engages in negative thought patterns including self-destruction and holding grudges. He shares that he does not have a social network except for co-workers, and that many of his previously enjoyed leisure activities are associated with alcohol use. Ronald Richard agrees to consider ways to increase his social circle and discusses going to [employer] with a peer from group.
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
## 2017-10-29 ENCOUNTER — Telehealth: Payer: Self-pay

## 2017-10-29 ENCOUNTER — Ambulatory Visit: Payer: PRIVATE HEALTH INSURANCE | Admitting: Psychiatry

## 2017-10-29 ENCOUNTER — Ambulatory Visit: Payer: PRIVATE HEALTH INSURANCE
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
## 2017-10-30 ENCOUNTER — Ambulatory Visit: Payer: PRIVATE HEALTH INSURANCE

## 2017-11-01 ENCOUNTER — Ambulatory Visit: Payer: PRIVATE HEALTH INSURANCE

## 2017-11-05 ENCOUNTER — Ambulatory Visit: Payer: PRIVATE HEALTH INSURANCE

## 2017-11-06 ENCOUNTER — Ambulatory Visit: Payer: PRIVATE HEALTH INSURANCE

## 2017-11-07 ENCOUNTER — Telehealth: Payer: Self-pay

## 2017-11-07 NOTE — Telephone Encounter (Signed)
Writer received a voicemail from Ronald Richard who reported he wanted to continue to engage in treatment if writer was able to accommodate while having current transportation issues. Writer called him back and let him know if he could come for lunch tomorrow writer could see him. If not, writer would like to schedule a time during his lunch next Wednesday or Thursday to further discuss.

## 2017-11-08 ENCOUNTER — Ambulatory Visit: Payer: PRIVATE HEALTH INSURANCE

## 2017-11-12 ENCOUNTER — Ambulatory Visit: Payer: PRIVATE HEALTH INSURANCE

## 2017-11-13 ENCOUNTER — Ambulatory Visit: Payer: PRIVATE HEALTH INSURANCE

## 2017-11-15 ENCOUNTER — Telehealth: Payer: Self-pay

## 2017-11-15 ENCOUNTER — Ambulatory Visit: Payer: PRIVATE HEALTH INSURANCE

## 2017-11-15 NOTE — Telephone Encounter (Signed)
Writer attempted to reach [place] due to not hearing back from him about scheduling an individual. Clinical research associate encouraged him to follow up with writer to be able to discuss how to continue to have him be in treatment. Writer let him know if he isn't seen by next Friday writer will need to staff for discharge due to loss of contact.

## 2017-11-19 ENCOUNTER — Ambulatory Visit: Payer: PRIVATE HEALTH INSURANCE

## 2017-11-20 ENCOUNTER — Ambulatory Visit: Payer: PRIVATE HEALTH INSURANCE

## 2017-11-21 ENCOUNTER — Telehealth: Payer: Self-pay

## 2017-11-21 NOTE — Telephone Encounter (Signed)
Writer attempted to reach [place] and left a voicemail. Writer let him know that if he isn't seen by the 11th he will be discharged due to loss of contact. WRtier encouraged him to Higher education careers adviser back if he would like to continue in treatment or be willing to come in to develop his discharge plan.

## 2017-11-22 ENCOUNTER — Ambulatory Visit: Payer: PRIVATE HEALTH INSURANCE

## 2017-11-22 ENCOUNTER — Ambulatory Visit: Payer: PRIVATE HEALTH INSURANCE | Attending: Psychiatry | Admitting: Psychiatry

## 2017-11-22 DIAGNOSIS — F1020 Alcohol dependence, uncomplicated: Secondary | ICD-10-CM | POA: Insufficient documentation

## 2017-11-22 DIAGNOSIS — F1120 Opioid dependence, uncomplicated: Secondary | ICD-10-CM

## 2017-11-22 DIAGNOSIS — F1220 Cannabis dependence, uncomplicated: Secondary | ICD-10-CM

## 2017-11-22 DIAGNOSIS — F329 Major depressive disorder, single episode, unspecified: Secondary | ICD-10-CM | POA: Insufficient documentation

## 2017-11-22 DIAGNOSIS — F419 Anxiety disorder, unspecified: Secondary | ICD-10-CM | POA: Insufficient documentation

## 2017-11-22 NOTE — Progress Notes (Signed)
NP Note    Patient in and met with therapist prior to meeting with Probation officer; Probation officer touched base with patient briefly after this and patient informed Probation officer that he did not need to meet with Probation officer, that he is not planning on continuing in program and that he is ' good'.  Writer expressed concern about his ongoing use and safety risks associated with this.  Patient informed Probation officer that he had discussed this with his counselor and felt good about his decision to not continue in treatment.    Medications:    None    Duration:  8 minutes      ICD-10-CM ICD-9-CM   1. Opioid use disorder, severe, dependence F11.20 304.00   2. Cannabis use disorder, moderate, dependence F12.20 304.30

## 2017-11-22 NOTE — BH Crisis Intervention (Signed)
[facility] Safety/Relapse Prevention Plan     Things that cause you to feel stressed or put you at risk for using:  arguments, being isolated/alone, feeling lonely, feeling worried, not being listened to and not getting own way    Warning signs:  breathing hard, can't sit still, isolating/avoiding behavior, lying, not taking care of self, racing heart, rudeness, swearing, sweating/racing heartbeat/breathing hard and wringing hands    Coping strategies:  being around others, doing chores/special jobs, exercising, taking a shower, talking to friends/family and using humor, working    Additional sober activities to engage in during stressful times.  1.  Going to the gym    What self-help meetings/other support groups will you attend?  1.  None    People whom I can ask for help:  Family member: Mother; phone: N/A    Keeping a safe environment:  access to Narcan kit    What will be the consequences for you if you relapse?  1.  Money; Job issues    Professionals or agencies I can contact during a crisis:  Clinician Name: Thersa Salt; Phone: [phone]    Clinician Emergency Contact number/Answering Service numbers:   [phone] (day time)   [phone] (after hours answering service)     [place]: [phone] or [place]: [phone]  [place] PHONE: [phone]  POLICE: [phone]    IF NEEDED, [place]      Patient Signature: ______________________________________________________      Clinician Signature: _____________________________________________________

## 2017-11-22 NOTE — Progress Notes (Deleted)
Opened in error  Duration:  0 minutes

## 2017-11-22 NOTE — BH Discharge Summary (Signed)
[facility] Discharge Plan    (Note to Health Information Management: When releasing this document, please send the document titled [facility] Discharge Summary, if present)       LIVING SITUATION AT DISCHARGE: [address] [place], [place] [address]    Significant Other/Family Need for Continued Services: N/A     VOCATIONAL/EMPLOYMENT/EDUCATIONAL STATUS: Employed full time       FOLLOW-UP PROVIDER APPOINTMENTS:  No current follow up appointments      Current Medications (at the time of document creation):  No current medications    Patient Participation:    Relapse prevention/discharge plan developed with patient  Relapse prevention/discharge plan reviewed with patient  Patient is in agreement with relapse prevention/discharge plan  Patient received a copy of the relapse prevention/discharge plan    Professional agencies you can contact in the event of a crisis:  [facility] OR MOBILE CRISIS: [phone] or 211  (TTY) [phone], (Out of [place]) [phone]   SUICIDE PREVENTION [place] PHONE: [phone]  POLICE: 911  IF NEEDED, GO TO THE HOSPITAL EMERGENCY ROOM

## 2017-11-26 ENCOUNTER — Ambulatory Visit: Payer: PRIVATE HEALTH INSURANCE

## 2017-11-27 ENCOUNTER — Ambulatory Visit: Payer: PRIVATE HEALTH INSURANCE

## 2017-11-27 NOTE — Progress Notes (Signed)
SUD Team Meeting    Present: Racheal Patches, LMSW, CASAC; Antionette Char, [facility], [place]; Gaylyn Lambert, MS, CASAC; Billie Ruddy, BS; Elsworth Soho, MD; Thersa Salt, MA; Franco Collet, BS; Lynnea Maizes, CRPA-P    - Therapist received a phone call on 10/29/17 in which he was reporting transportation issues and was considering no longer coming to treatment. Therapist attempted to reach him over a few weeks to discuss how to assist him in transportation issues and explore any other concerns he may have about being able to engage in treatment. He contacted therapist back on 11/21/17 reporting he had plans to come to his appointment with Barbee Shropshire despite not getting his Suboxone script since 10/25/17. When he attended this session, therapist saw him instead and he was requesting to discontinue treatment. Therapist developed his discharge plan, a new relapse prevention plan, provided narcan kit, and community resources. Eve participated in the discharge planning process with Quin as well.    Disposition: Approved for discharge at patient request.

## 2017-11-28 NOTE — Progress Notes (Signed)
[facility] Individual Progress Note     Duration:  30 minutes      Contact Type:  Location: On Site    Face to Face     Eligibility for previously-prescribed controlled substance:  None at this time    Last Toxicology Screen Date: 08/17/17     Last reported/recorded use: 11/22/17 - opiates; marijuana      Treatment/Recovery Goals Addressed:    Treatment Problem #1 04/11/2017   1ST TREATMENT PLAN PROBLEM Albin has struggles to abstain from use of substances, which has created negative consequences in his life.     Treatment Goal #1 04/11/2017   1ST TREATMENT PLAN GOAL Jacon will abstain from all substances and develop a lifestyle of recovery.     Treatment Problem #2 04/11/2017   2ND TREATMENT PLAN PROBLEM Wofford has a history of mental health and is concerned about the impact it has had on his use and relationships.     Treatment Goal #2 04/11/2017   2ND TREATMENT PLAN GOAL Duke will develop an awareness of his mental health and the develop skills to cope without using substances.      Treatment Problem #3 09/13/2017   3RD TREATMENT PLAN PROBLEM Kylin has limited support from family members.     Treatment Goal #3 09/13/2017   3RD TREATMENT PLAN GOAL Treasure will further engage his family in his recovery.       Domestic Violence Screen:  No indication to update domestic violence screen.    Evidence-based or Clinical Intervention Used:  Cognitive Behavioral Therapy  Motivational Interviewing    Session summary/response to session:   Huntley arrived to his appointment with Barbee Shropshire, NP, in which writer asked to speak to him first. Probation officer met with him and got an update from [place] about his use since since writer has not seen him since 10/25/17. He reported to Probation officer he was using $200 a week in percocet's and $100 a week on marijuana. He reported at this time "not wanting this right now" and came to the session "to give you guys the courtesy of telling you in person". He reported at this time not wanting to  stop using as "it is like my only friend". He reported moving into his new apartment and typically only sees people from work. He did report having a couple of work friends he will spend time with after work, but found they typically would meet at the bars. Writer provided him with a Narcan kit and showed him how to use it. He also developed a new relapse prevention plan and provided him with it.     Ludwig reported feeling depressed and anxious and acknowledged that he was self medicating at this time. "I am alright with that right now though". He denied any suicidal or homicidal ideations. He was open to Probation officer providing him with resources within the community for mental health therapy, which Probation officer provided him. He further asked questions about Suboxone providers within the community and was provided with a list of providers. Writer discussed him wanting Suboxone but reported "not wanting to do this group therapy thing". He did report a lack of interest due to not wanting to have to attend before or after group, not having transportation, and the finances. Writer discussed the previous plan of him attending weekly to help while he temporarily was out of a vechicle, but he declined at this time. Writer reviewed his current expenses on use and compared to treatment and transportation, but  he continued to report "I am just not invested at this time".    Writer further developed his discharge plan with him and provided him with the resources and Narcan kit. Writer encouraged him to follow up with writer if he has any questions and provided him with the main number in the event he would like to re-engage in treatment.     Plan  Staff for discharge due to patient request to discontinue treatment.    Current Treatment Plan   Created/Updated On 09/13/2017   Next Treatment Plan Due 12/12/2017       (Please complete the Domestic Abuse and Neglect screening question on the Screening Navigator during treatment plan review  session)

## 2017-11-29 ENCOUNTER — Ambulatory Visit: Payer: PRIVATE HEALTH INSURANCE

## 2017-12-04 ENCOUNTER — Ambulatory Visit: Payer: PRIVATE HEALTH INSURANCE

## 2017-12-05 DIAGNOSIS — F1120 Opioid dependence, uncomplicated: Secondary | ICD-10-CM

## 2017-12-05 DIAGNOSIS — F1410 Cocaine abuse, uncomplicated: Secondary | ICD-10-CM

## 2017-12-05 DIAGNOSIS — F1020 Alcohol dependence, uncomplicated: Secondary | ICD-10-CM

## 2017-12-05 DIAGNOSIS — F1220 Cannabis dependence, uncomplicated: Secondary | ICD-10-CM

## 2017-12-06 ENCOUNTER — Ambulatory Visit: Payer: PRIVATE HEALTH INSURANCE
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
## 2017-12-26 ENCOUNTER — Telehealth: Payer: Self-pay

## 2017-12-26 NOTE — Telephone Encounter (Signed)
Writer received voicemail from pt stating he would like to return to [facility] for treatment. Writer returned call to pt but there was no answer. Writer left a voicemail with instructions for a return call.

## 2017-12-27 ENCOUNTER — Telehealth: Payer: Self-pay

## 2017-12-27 NOTE — Telephone Encounter (Signed)
Ancillary Withdrawal Services Phone Screen     Date of Service: December 27, 2017    Patient Information:  Patient's Name: Ronald Richard  Patient's Date of Birth: Nov 03, 1985  Patient's Medical Record Number: [id number]  Patient's Home Phone: [phone] (home)  Patient's Work Phone: [phone] (work)  Information systems manager:   Telephone Information:   Mobile [phone]     Patient's Marital Status (if applicable): Single    Insurance Information:  Other: [employer]      Referral Source:  Referral Source: Self-Referral     Current Living Situation:       --Residence/ Living Situation: Private Residence       --Homeless? No         -- Veteran Status:  No    Are you an IV drug user? No    Are you pregnant? No     Do you have a history of seizures?  No    Have you ever been to a detox before? No    Are you currently engaged in other CD services? No         Presenting Concern:   What is the reason you are seeking care?     (Please provide a brief description of the reasons the patient or referring provider is seeking mental health treatment at this time.  Please use patients own words where possible).    Pt was recently engaged in outpatient treatment at [facility] from January 2019 - October 2019 (see discharge summary dated 12/05/17). Pt chose to end his treatment at that time but has now decided he would like to come back and be prescribed Suboxone again. Writer explained that outpatient stabilization program to pt and he reports interest in it. He states he is currently using about 5 or 6 pills of 7.5mg  Percocet per day. He shared that he typically starts to feel withdrawal symptoms after 6 to 8 hours of last use, but has been able to go over 24 hours in order to start on Suboxone in the past. He states he also smokes THC 2 or 3 times per day. He reports alcohol use "here and there, a few times a month". He denies the recent use of any other substances. Writer reviewed case with Billy Fischer, NP and pt will be offered an appointment for Monday 11/11 to further assess appropriateness for the stabilization program.        Primary substance use:  Percocet    What is the frequency of use/ how much at each setting? About 5 or 6 7.5mg  pills per day  Last use: daily  Previous treatment for withdrawal? Pt denies      Secondary substance use:  marijuana    How do you use it? smoking  What is the frequency of use/ how much at each setting? 2 or 3 times per day  Last use: daily  Previous treatment for withdrawal? n/a       Physical Health:  Do you have any medical issues/concerns? No  Do you have any allergies? No  Do you have a PCP? No  Are you prescribed any medications? No  IStop database has been checked. Yes     Search Terms: Davian, Hanshaw 02-16-1987Search Date: 12/27/2017 01:20:24 PMSearching on behalf of: [id number] - Almetta Lovely [facility]  The Drug Utilization Report below displays all of the controlled substance prescriptions, if any, that your patient has filled in the last twelve months. The information displayed on this report is compiled from  pharmacy submissions to the Department, and accurately reflects the information as submitted by the pharmacies.    This report was requested by: Velvet Bathe   Reference #: [id number]    Almetta Lovely Baciewicz's Prescriptions  Patient Name: Callin Ashe Birth Date: Sep 03, 1985  Address: [address] [place], [place] [address] Sex: Male  Rx Written Rx Dispensed Drug Quantity Days Supply Prescriber Name  09/13/2017 09/14/2017 buprenorphine-naloxone 8-2 mg sl film 7 7 Gabriel Rainwater MD  05/08/2017 05/10/2017 suboxone 8 mg-2 mg sl film 7 7 Gabriel Rainwater MD  Others' Prescriptions  Patient Name: Curtis Cain Birth Date: 28-Aug-1985  Address: [address] [place], [place] [address] Sex: Male  Rx Written Rx Dispensed Drug Quantity Days Supply Prescriber Name  10/25/2017 10/26/2017 buprenorphine-naloxone 8-2 mg sl film 7 7 Andersen-Buescher,  Eve  10/16/2017 10/18/2017 buprenorphine-naloxone 8-2 mg sl film 7 7 Andersen-Buescher, Eve  09/27/2017 10/04/2017 buprenorphine-naloxone 8-2 mg sl film 7 7 Andersen-Buescher, Eve  09/19/2017 09/21/2017 buprenorphine-naloxone 8-2 mg sl film 7 7 Andersen-Buescher, Eve  09/06/2017 09/06/2017 suboxone 8 mg-2 mg sl film 7 7 Andersen-Buescher, Eve  08/01/2017 08/03/2017 suboxone 8 mg-2 mg sl film 7 7 Andersen-Buescher, Eve  07/23/2017 07/26/2017 buprenorphine-naloxone 8-2 mg sl film 7 7 Andersen-Buescher, Eve  07/09/2017 07/11/2017 buprenorphine-naloxone 8-2 mg sl film 7 7 Andersen-Buescher, Eve  06/28/2017 06/29/2017 buprenorphine-naloxone 8-2 mg sl film 7 7 Andersen-Buescher, Eve  06/14/2017 06/15/2017 buprenorphine-naloxone 8-2 mg sl film 7 7 Andersen-Buescher, Eve  06/07/2017 06/08/2017 suboxone 8 mg-2 mg sl film 7 7 Andersen-Buescher, Eve  05/25/2017 05/28/2017 buprenorphine-naloxone 8-2 mg sl film 7 7 Andersen-Buescher, Eve  05/17/2017 05/21/2017 buprenorphine-naloxone 8-2 mg sl film 7 7 Andersen-Buescher, Eve  04/26/2017 04/27/2017 buprenorphine-naloxone 8-2 mg sl film 7 7 Andersen-Buescher, Eve  04/19/2017 04/20/2017 buprenorphine-naloxone 8-2 mg sl film 7 7 Andersen-Buescher, Eve  04/13/2017 04/13/2017 suboxone 8 mg-2 mg sl film 7 7 Andersen-Buescher, Eve  04/05/2017 04/06/2017 suboxone 8 mg-2 mg sl film 7 7 Andersen-Buescher, Eve  03/29/2017 03/29/2017 suboxone 8 mg-2 mg sl film 7 7 Andersen-Buescher, Eve  03/28/2017 03/28/2017 suboxone 8 mg-2 mg sl film 1 1 Andersen-Buescher, Eve  03/27/2017 03/27/2017 suboxone 8 mg-2 mg sl film 1 1 Andersen-Buescher, Eve  * - Drugs marked with an asterisk are compound drugs. If the compound drug is made up of more than one controlled substance, then each controlled substance will be a separate row in the table.         Mental Health History:  Are you currently involved in mental health treatment?  No      Risk Assessment:  Self Injury: Patient Denies  Suicidal Ideation:  Patient Denies  Homicidal Ideation: Patient Denies  Aggressive Behavior: Patient Denies    If yes, plan: Other: N/A      Legal System Involvement:  Any current involvement with legal system? No   Legal contact name: N/A  Upcoming court/appearance date? No    Will legal status be a barrier to entering and remaining in treatment? No         Customer Service:  Interpreter Services:No     Plan  DISPOSITION: Case was reviewed with Billy Fischer, NP and pt has appointment to come to the stabilization program at [facility] on 11/11 to be further assessed for appropriateness.    DATE OF ADMISSION: 12/31/17 @ 8:00AM

## 2017-12-31 ENCOUNTER — Ambulatory Visit: Payer: PRIVATE HEALTH INSURANCE | Admitting: Psychiatry

## 2017-12-31 ENCOUNTER — Telehealth: Payer: Self-pay

## 2017-12-31 NOTE — Telephone Encounter (Signed)
Pt did not show for the stabilization program today. Writer outreached to pt and he reports he had a meeting at work this morning and therefore wasn't able to get to his appointment at [facility]. Pt states he would like to try again for day 1 of the stabilization program tomorrow morning. Writer added him to the schedule.

## 2018-01-01 ENCOUNTER — Ambulatory Visit: Payer: PRIVATE HEALTH INSURANCE | Admitting: Psychiatry

## 2018-01-01 ENCOUNTER — Telehealth: Payer: Self-pay

## 2018-01-01 NOTE — Telephone Encounter (Signed)
Pt was scheduled to start the stabilization program at [facility] today and did not show. Writer outreached to pt and he did not answer. Writer left a voicemail with instructions for a direct return call.

## 2018-01-08 ENCOUNTER — Encounter: Payer: Self-pay | Admitting: Gastroenterology

## 2018-01-08 ENCOUNTER — Ambulatory Visit: Payer: PRIVATE HEALTH INSURANCE | Attending: Psychiatry | Admitting: Psychiatry

## 2018-01-08 VITALS — BP 153/95 | HR 59 | Ht 72.0 in | Wt 275.0 lb

## 2018-01-08 DIAGNOSIS — F1120 Opioid dependence, uncomplicated: Secondary | ICD-10-CM | POA: Insufficient documentation

## 2018-01-08 DIAGNOSIS — F1220 Cannabis dependence, uncomplicated: Secondary | ICD-10-CM | POA: Insufficient documentation

## 2018-01-08 MED ORDER — TRAZODONE HCL 50 MG PO TABS *I*
50.0000 mg | ORAL_TABLET | Freq: Every evening | ORAL | 0 refills | Status: AC | PRN
Start: 2018-01-08 — End: 2018-01-22

## 2018-01-08 MED ORDER — CLONIDINE HCL 0.1 MG PO TABS *I*
0.1000 mg | ORAL_TABLET | Freq: Two times a day (BID) | ORAL | 1 refills | Status: AC | PRN
Start: 2018-01-08 — End: ?

## 2018-01-08 NOTE — Progress Notes (Signed)
Ancillary Withdrawal Services Individual Progress Note     LENGTH OF SESSION: 15 minutes    Contact Type:  Location: On Site    Brief Face to Face     Last Toxicology Screen Date: 01-08-18    Last reported/recorded use: POCT. +THC, +Benzo, +Oxy (Will be sent to lab for further testing)      Risk Evaluation:     Risk (suicidal or homicidal)     Any thoughts of harming yourself or others?  Self Injurious Behavior: Patient Denies, Suicidal Ideation: Patient Denies, Homicidal Ideation: Patient Denies, Aggressive Behavior: Patient Denies      Session Content  Treatment plan developed with patient    Evidence-based or Clinical Intervention Used:  Relapse Prevention  Medication Supported Recovery  Individual Drug Counseling    Session summary/response to session: Probation officer met with Ronald Richard on day one of the ancillary stabilization program. Writer briefly discussed what brought pt to come today as there has been no contact with pt since missing last week's attempts at starting the program. Ronald Richard discussed he didn't want to come last week due to going out of town on Thursday and knew he still had pills left so did not want to come in on Monday 01-07-18. Ronald Richard discussed that he woke up this morning and wanted to come in for help. Ronald Richard reported he had used benzodiazepines on the drive back from [place] because a friend gave them to him and did not have any pain pills. Ronald Richard denies any consistent use of benzodiazepines and is aware his suboxone dose will only get to a certain amount due to safety concerns. Ronald Richard expressed understanding. Ronald Richard was informed of the the expectations of the stabilization program and is aware he will be coming daily. Ronald Richard denied wanting a Narcan kit as he states he has access to one at home. Ronald Richard denies any assistance with transportation and will return on Wednesday 01-09-18 at 830am.       Plan  Continue Treatment as planned.    Current Treatment Plan   Created/Updated On 09/13/2017    Next Treatment Plan Due 12/12/2017          (Please complete the Domestic Abuse and Neglect screening question on the Screening Navigator during treatment plan review session)

## 2018-01-08 NOTE — Progress Notes (Signed)
Ronald Richard is a 32 y.o. male was seen today for Day 1 of the Ancillary Withdrawal Program. he has been assessed by nursing and the following items have been completed:     1. [facility] Alcohol, Sedative, Hypnotic, Anxiolytic Safety Contract for Opiate Replacement Therapy, was reviewed with Geanie Kenning and was signed today.  2. Rapid Urine toxicology was obtained, results see POCT. +THC, +Benzo, +Oxy  3. Urine sent for confirmation   4. BAC: 0.000  5. PPD was placed in Left forearm, lot# [id number], expiration date 07/11/19. Pt. denies a history of positive PPD and was instructed to return for the read on 48-72 hours.  6. COWS Assessment  Complete Assessment (From Documentation Flowsheet) - COWS    Resting Pulse Rate: Pulse rate 80 or below  Sweating: Subjective report of chills or flushing  Restlessness: Reports difficulty sitting still, but is able to do so  Pupil Size: Pupils pinned or normal size for room light  Bone or Joint Aches: Not present  Runny Nose or Tearing: Nasal stuffiness or unusually moist eyes  GI Upset: Nausea or loose stool  Tremors: No tremor  Yawning: No yawning  Anxiety or Irritabilitiy: Patient reports increasing irritability or anxiousness  Gooseflesh Skin: Skin is smooth  Opiate Withdrawal Score: 6  7. I-Stop data:     Geanie Kenning - 69 Prescriptions  Confidential Drug Report  Search Terms: Tajee Savant, 03-19-1985   Search Date: 01/08/2018 09:16:57 AM   Searching on behalf of: [id number] - Gabriel Rainwater   The Drug Utilization Report below displays all of the controlled substance prescriptions, if any, that your patient has filled in the last twelve months. The information displayed on this report is compiled from pharmacy submissions to the Department, and accurately reflects the information as submitted by the pharmacies.  This report was requested by: [facility] Aaniya Sterba   Reference #: [id number]   Almetta Lovely Baciewicz's Prescriptions  Patient Name: Vivaan Helseth Birth Date: 1985-11-15    Address: [address] [place], [place] [address] Sex: Male   Rx Written Rx Dispensed Drug Quantity Days Supply Prescriber Name Payment Method Dispenser   09/13/2017 09/14/2017 buprenorphine-naloxone 8-2 mg sl film  7 7 Gabriel Rainwater MD [facility]   05/08/2017 05/10/2017 suboxone 8 mg-2 mg sl film  7 7 Gabriel Rainwater MD [facility]   Others' Prescriptions  Patient Name: Demauri Advincula Birth Date: 05-08-1985   Address: [address] [place], [place] [address] Sex: Male   Rx Written Rx Dispensed Drug Quantity Days Supply Prescriber Name Payment Method Dispenser   10/25/2017 10/26/2017 buprenorphine-naloxone 8-2 mg sl film  7 7 Andersen-Buescher, Eve [facility]   10/16/2017 10/18/2017 buprenorphine-naloxone 8-2 mg sl film  7 7 Andersen-Buescher, Eve [facility]   09/27/2017 10/04/2017 buprenorphine-naloxone 8-2 mg sl film  7 7 Andersen-Buescher, Eve [facility]   09/19/2017 09/21/2017 buprenorphine-naloxone 8-2 mg sl film  7 7 Andersen-Buescher, Eve [facility]   09/06/2017 09/06/2017 suboxone 8 mg-2 mg sl film  7 7 Andersen-Buescher, Eve [facility]   08/01/2017 08/03/2017 suboxone 8 mg-2 mg sl film  7 7 Andersen-Buescher, Eve [facility]   07/23/2017 07/26/2017 buprenorphine-naloxone 8-2 mg sl film  7 7 Andersen-Buescher, Eve [facility]   07/09/2017 07/11/2017 buprenorphine-naloxone 8-2 mg sl film  7 7 Andersen-Buescher, Eve [facility]   06/28/2017 06/29/2017 buprenorphine-naloxone 8-2 mg sl  film  7 7 Andersen-Buescher, Eve [facility]   06/14/2017 06/15/2017 buprenorphine-naloxone 8-2 mg sl film  7 7 Andersen-Buescher, Eve [facility]   06/07/2017 06/08/2017 suboxone 8 mg-2 mg sl film  7 7 Andersen-Buescher, Eve [facility]   05/25/2017 05/28/2017  buprenorphine-naloxone 8-2 mg sl film  7 7 Andersen-Buescher, Eve [facility]   05/17/2017 05/21/2017 buprenorphine-naloxone 8-2 mg sl film  7 7 Andersen-Buescher, Eve [facility]   04/26/2017 04/27/2017 buprenorphine-naloxone 8-2 mg sl film  7 7 Andersen-Buescher, Eve [facility]   04/19/2017 04/20/2017 buprenorphine-naloxone 8-2 mg sl film  7 7 Andersen-Buescher, Eve [facility]   04/13/2017 04/13/2017 suboxone 8 mg-2 mg sl film  7 7 Billy Fischerndersen-Buescher, Eve [facility]   04/05/2017 04/06/2017 suboxone 8 mg-2 mg sl film  7 7 Billy Fischerndersen-Buescher, Eve [facility]   03/29/2017 03/29/2017 suboxone 8 mg-2 mg sl film  7 7 Billy Fischerndersen-Buescher, Eve [facility]   03/28/2017 03/28/2017 suboxone 8 mg-2 mg sl film  1 1 Andersen-Buescher, Eve [facility]   03/27/2017 03/27/2017 suboxone 8 mg-2 mg sl film  1 1 Andersen-Buescher, [facility]     8. Collaboration between nursing, counseling staff, and the providers maintained through the process for support to [facility].  9. Medications that were brought in by patient were reviewed and updated in the erecord medication section.    Last reported use by patient is yesterday around 0900 am with use of 5-6x 5 mg percocet intranasally which includes street Suboxone/Methadone. Patient reports he is getting the pills from the street and not from prescription. Reports use of THC daily with last use on Sunday. Reports using benzodiazepines intermittently with last use of a "totem pole" of xanax intranasally a week ago. Reports using heroin intranasally one month ago and denies use of fentanyl in over 6 months. Reports he started using substances at 17 or 18 with use of pain pills at 19 when he had knee surgery and was prescribed percocet.      1st Suboxone dose 2 mg administered by Benjie Karvonenanya Pedro Whiters, RN at 1005 am.      Reassessment of COWS at 1035 am was Opiate Withdrawal Score: 4    2nd Suboxone dose 2 mg administered by Kenney Housemananya Brenisha Tsui, RN at 1050 am.     Reassessment of COWS at 1120 am was Opiate Withdrawal Score: 1    3rd Suboxone dose 2 mg administered by Kenney Housemananya Markell Sciascia, RN at 1140 am    Patient was last evaluated at 1142 am and left the clinic in the accompaniment of alone with instructions to assess their symptoms this evening using the COWS scale. They are to return tomorrow for continued treatment.     Benjie KarvonenANYA Blondina Coderre, RN

## 2018-01-08 NOTE — Progress Notes (Signed)
Ancillary Withdrawal Services Provider Initial Assessment      Chief Complaint/Reason for Encounter: Stabilization program, Day 1     History of Presenting Illness: Patient presents to stabilization program and states that he was discharged from [facility] a month ago. The patient reports that he has been using percocet, marijuana , alcohol and xanax. He has been sniffing the percocet. He also reported that this time he is motivated to continue wit the program and wants to get clean . He has tried suboxone ( 8 mg) in the past  Denies any mania, psychosis, HI,SI,AVH or any acute safety concerns. His last use was on 11/16 -11/17    Any new medical issues in the past 7 days:     History of Withdrawal: Yes    History of past detoxifications from any substance: Yes    Alcohol: saturday 9/16, beers, vodka, 4 times a week  Amphetamines (e.g. Adderall, Ritalin.Marland Kitchen): 3 years ,gives anxiety   Cannabis: 1 joint , typically three times a day , 15 -20 dollars   Cocaine: week ago typically 80 dollars in [place]   Hallucinogens: mushrooms , 1 month ago , mid twenties regularly used , last  use 2 years ago    LSD: one time , bad trip   Molly: 32 years of age used to use escatsy ( 10 years ago)  Nicotine: has tried , doesnt smoke   Non-prescribed Benzodiazepines: yes , has been using it in late teens , 5 bars highest .   Opiates: percoset , "heroin here and there "  Prescribed Benzodiazepines: no  Synthetic Cannabinoids: no  Synthetic Stimulants ("Bath Salts"): no     Longest sobriety: 73 to 32 years of age ,relationship       Past, Family, Social History:   Lives in his apartment and has a job     Allergy History as of 01/08/18      No Known Allergies (drug, envir, food or latex)                Review of Systems  Pertinent items are noted in HPI.    Current Medication Side Effects: no    Last Alcohol & Drug Use:    Longest sobriety: 1 year     Checked [place] database today (01/08/18). No unexpected prescriptions were found in this  database.     PHQ-9  PHQ 02/27/2017   PHQ Total Score 6       Visit Vitals  BP 146/90 (BP Location: Right arm, Patient Position: Sitting, Cuff Size: adult)   Pulse 62   Ht 1.829 m (6')   Wt 124.7 kg (275 lb)   BMI 37.30 kg/m       Current COWS: Complete Assessment (From Documentation Flowsheet) - COWS    Resting Pulse Rate: Pulse rate 80 or below  Sweating: No report of chills or flushing  Restlessness: Able to sit still  Pupil Size: Pupils pinned or normal size for room light  Bone or Joint Aches: Not present  Runny Nose or Tearing: Not present  GI Upset: Stomach cramps  Tremors: No tremor  Yawning: No yawning  Anxiety or Irritabilitiy: None  Gooseflesh Skin: Skin is smooth  Opiate Withdrawal Score: 1    Current CIWA: Complete Assessment (From Documentation Flowsheet) - CIWA-AR  General Physical Assessment  General Appearance: alert and appears stated age  Eyes: negative, conjunctivae/corneas clear. PERRL, EOM's intact. Fundi benign.  Extremities: extremities normal, atraumatic, no cyanosis or edema  Skin: Skin color, texture, turgor normal. No rashes or lesions  Neurologic: Grossly normal      ASSESSMENT OF RISK FOR SUICIDAL BEHAVIOR  Changes in risk for suicide from baseline Formulation of Risk and/or previous intake, including newly identified risk, if any: none    ASSESSMENT OF RISK FOR VIOLENT BEHAVIOR  Changes in risk for violence from baseline Formulation of Risk and/or previous intake, including newly identified risk, if any: none      MEDICAL DECISION MAKING       Diagnoses:  Opoid Use disorder     Formulation:  Ronald Richard is a 32 y.o. 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 The patient has tried suboxone in the past and is interested in starting suboxone . He was able to tolerate 6 mg without any side effects . The patient did talk about poor sleep and will be prescribed trazodone to help with sleep. We will also prescribe  clonidine to help with withdrawal symptoms. The paient agreeable to the plan     Medications/Medical Records/Labs/Diagnostic Tests Reviewed:  Current Outpatient Medications   Medication Sig    buprenorphine-naloxone (SUBOXONE) 8-2 MG SL film Place 1 Film under the tongue daily   Max daily dose: 1 Film    cloNIDine (CATAPRES) 0.1 MG tablet Take 1 tablet (0.1 mg total) by mouth nightly    docusate sodium (COLACE) 100 MG capsule Take 1 capsule (100 mg total) by mouth 2 times daily       Counseling/Coordination of Care:  Diagnostic results/impressions and/or recommended studies were discussed  Instruction for detox treatment/follow-up discussed  Risks and benefits of treatment options discussed  Education provided to patient/family/caregiver  Side effects and benefits of his medications were discussed   Side effect management discussed  Adjustments of medication timing discussed  Risk factor reduction discussed  Prognosis discussed  Importance of adherence to treatment regimen discussed  Relapse prevention discussed  Tobacco Dependence discussed  If BMI is elevated, patient was referred to PCP     Plan:  Plan of Care remains the same    Length of Visit:  20 min   Greater than 50% of face to face time was spent providing counseling and/or coordination of care.

## 2018-01-09 ENCOUNTER — Ambulatory Visit: Payer: PRIVATE HEALTH INSURANCE | Admitting: Psychiatry

## 2018-01-09 ENCOUNTER — Encounter: Payer: Self-pay | Admitting: Gastroenterology

## 2018-01-09 VITALS — BP 129/73 | HR 65 | Ht 72.0 in | Wt 276.0 lb

## 2018-01-09 DIAGNOSIS — F1120 Opioid dependence, uncomplicated: Secondary | ICD-10-CM

## 2018-01-09 NOTE — Progress Notes (Signed)
Ronald Richard is a 32 y.o. male was seen today for Day 2 of the Ancillary Withdrawal Program. he has been assessed by nursing and the following items have been completed:     1. Rapid Urine toxicology was obtained, results see POCT.  2. Urine sent for confirmation: No, oral swab obtained  4. COWS Assessment  Complete Assessment (From Documentation Flowsheet) - COWS    Resting Pulse Rate: Pulse rate 80 or below  Sweating: No report of chills or flushing  Restlessness: Reports difficulty sitting still, but is able to do so  Pupil Size: Pupils pinned or normal size for room light  Bone or Joint Aches: Not present  Runny Nose or Tearing: Nose running or tearing  GI Upset: Stomach cramps  Tremors: No tremor  Yawning: No yawning  Anxiety or Irritabilitiy: Patient reports increasing irritability or anxiousness  Gooseflesh Skin: Skin is smooth  Opiate Withdrawal Score: 5  5. I-Stop data:     Ronald Richard - 16 Prescriptions  Confidential Drug Report  Search Terms: Ronald Richard, 1985/08/02   Search Date: 01/09/2018 10:56:41 AM   Searching on behalf of: Ronald Richard - Ronald Richard   The Drug Utilization Report below displays all of the controlled substance prescriptions, if any, that your patient has filled in the last twelve months. The information displayed on this report is compiled from pharmacy submissions to the Department, and accurately reflects the information as submitted by the pharmacies.  This report was requested by: Ronald Richard   Reference #: [id number]   Ronald Richard Prescriptions  Patient Name: Ronald Richard Birth Date: Aug 04, 1985   Address: [address] [place], [place] [address] Sex: Male   Rx Written Rx Dispensed Drug Quantity Days Supply Prescriber Name Payment Method Dispenser   09/13/2017 09/14/2017 buprenorphine-naloxone 8-2 mg sl film  7 7 Ronald Richard [facility]   05/08/2017 05/10/2017 suboxone 8 mg-2 mg sl film  7 7 Ronald Richard [facility]   Others' Prescriptions  Patient Name: Ronald Richard Birth Date: [birth year]   Address: [address] [place], [place] [address] Sex: Male   Rx Written Rx Dispensed Drug Quantity Days Supply Prescriber Name Payment Method Dispenser   10/25/2017 10/26/2017 buprenorphine-naloxone 8-2 mg sl film  7 7 Ronald Richard, Ronald [facility]   10/16/2017 10/18/2017 buprenorphine-naloxone 8-2 mg sl film  7 7 Ronald Richard, Ronald [facility]   09/27/2017 10/04/2017 buprenorphine-naloxone 8-2 mg sl film  7 7 Ronald Richard, Ronald [facility]   09/19/2017 09/21/2017 buprenorphine-naloxone 8-2 mg sl film  7 7 Ronald Richard, Ronald [facility]   09/06/2017 09/06/2017 suboxone 8 mg-2 mg sl film  7 7 Ronald Richard, Ronald [facility]   08/01/2017 08/03/2017 suboxone 8 mg-2 mg sl film  7 7 Ronald Richard, Ronald [facility]   07/23/2017 07/26/2017 buprenorphine-naloxone 8-2 mg sl film  7 7 Ronald Richard, Ronald [facility]   07/09/2017 07/11/2017 buprenorphine-naloxone 8-2 mg sl film  7 7 Ronald Richard, Ronald [facility]   06/28/2017 06/29/2017 buprenorphine-naloxone 8-2 mg sl film  7 7 Ronald Richard, Ronald [facility]   06/14/2017 06/15/2017 buprenorphine-naloxone 8-2 mg sl film  7 7 Ronald Richard, Ronald [facility]   06/07/2017 06/08/2017 suboxone 8 mg-2 mg sl film  7 7 Ronald Richard, Ronald [facility]   05/25/2017 05/28/2017 buprenorphine-naloxone 8-2 mg sl film  7 7 Ronald Richard, [facility]   05/17/2017 05/21/2017 buprenorphine-naloxone 8-2 mg sl film  7 7 Ronald Richard, Ronald [facility]   04/26/2017 04/27/2017 buprenorphine-naloxone 8-2 mg sl film  7 7 Ronald Richard, Ronald [facility]   04/19/2017 04/20/2017 buprenorphine-naloxone 8-2  mg sl film  7 7 Ronald Richard, Ronald [facility]   04/13/2017 04/13/2017 suboxone 8 mg-2 mg sl film  7 7 Ronald Richard, Ronald [facility]   04/05/2017 04/06/2017 suboxone 8 mg-2 mg sl film  7 7 Ronald Richard, Ronald [facility]   03/29/2017 03/29/2017 suboxone 8 mg-2 mg sl film  7 7 Ronald Richard, Ronald [facility]   03/28/2017 03/28/2017 suboxone 8 mg-2 mg sl film  1 1 Ronald Richard, Ronald [facility]   03/27/2017 03/27/2017 suboxone 8 mg-2 mg sl film  1 1 Ronald [facility], [facility]       6. Collaboration between nursing, counseling staff, and the providers maintained through the process for support to [employer] Richard.    Last reported use by patient is denies use of all illicit substances since last appointment yesterday which includes street Suboxone/Methadone.    1st Suboxone dose 8 mg administered by Ronald GurneyGena Odette Watanabe, Richard  at 1000     Reassessment of COWS at 1029 was Opiate Withdrawal Score: 3    Safety contract reviewed and signed, patient expressed verbal understanding of this information.     Intake to be completed tomorrow per Ronald Richard, Ronald AlexandersJustin is aware.    Ronald GurneyGena Gizel Riedlinger, Richard

## 2018-01-10 ENCOUNTER — Encounter: Payer: Self-pay | Admitting: Gastroenterology

## 2018-01-10 ENCOUNTER — Encounter: Payer: Self-pay | Admitting: Psychiatry

## 2018-01-10 ENCOUNTER — Ambulatory Visit: Payer: PRIVATE HEALTH INSURANCE

## 2018-01-10 ENCOUNTER — Ambulatory Visit: Payer: PRIVATE HEALTH INSURANCE | Admitting: Psychiatry

## 2018-01-10 VITALS — BP 136/84 | HR 73

## 2018-01-10 DIAGNOSIS — F1120 Opioid dependence, uncomplicated: Secondary | ICD-10-CM

## 2018-01-10 LAB — POCT URINE DRUG SCREEN 12 PANEL
AMP (1000ng/mL): NEGATIVE
BAR (300ng/mL): NEGATIVE ng/mL
BZD (300 ng/mL): NEGATIVE
COC (300ng/mL): NEGATIVE
MDMA (500ng/mL): NEGATIVE
MET (1000ng/mL): NEGATIVE
MTD (300ng/mL): NEGATIVE
OPI (300ng/mL): NEGATIVE
OXY (100ng/mL): NEGATIVE
PCP (25ng/mL): NEGATIVE
TCA (1000ng/mL): NEGATIVE
THC (50ng/mL): POSITIVE

## 2018-01-10 NOTE — Progress Notes (Signed)
Ronald Richard is a 32 y.o. male was seen today for Day 3 of the Ancillary Withdrawal Program. he has been assessed by nursing and the following items have been completed:     1. Rapid Urine toxicology was obtained, results see POCT.e  2. Urine sent for confirmation: Yes, positive for benzo and THC, bupenorphine  3. BAC negative  4. PPD was read as negative    5. COWS Assessment  Complete Assessment (From Documentation Flowsheet) - COWS    Resting Pulse Rate: Pulse rate 80 or below  Sweating: No report of chills or flushing  Restlessness: Frequent shifting or extraneous movements of legs/arms  Pupil Size: Pupils pinned or normal size for room light  Bone or Joint Aches: Mild diffuse discomfort  Runny Nose or Tearing: Nasal stuffiness or unusually moist eyes  GI Upset: Stomach cramps  Tremors: No tremor  Yawning: No yawning  Anxiety or Irritabilitiy: Patient reports increasing irritability or anxiousness  Gooseflesh Skin: Skin is smooth  Opiate Withdrawal Score: 7  6. I-Stop data:     Geanie Kenning - 16 Prescriptions  Confidential Drug Report  Search Terms: Federico Maiorino, 08/25/1985   Search Date: 01/10/2018 09:52:38 AM   Searching on behalf of: [id number] - Gabriel Rainwater   The Drug Utilization Report below displays all of the controlled substance prescriptions, if any, that your patient has filled in the last twelve months. The information displayed on this report is compiled from pharmacy submissions to the Department, and accurately reflects the information as submitted by the pharmacies.  This report was requested by: Raymond Gurney   Reference #: [id number]   Almetta Lovely Baciewicz's Prescriptions  Patient Name: Kasyn Rolph Birth Date: 1985-05-02   Address: [address] [place], [place] [address] Sex: Male   Rx Written Rx Dispensed Drug Quantity Days Supply Prescriber Name Payment Method Dispenser   09/13/2017 09/14/2017 buprenorphine-naloxone 8-2 mg sl film  7 7 Gabriel Rainwater MD [facility]    05/08/2017 05/10/2017 suboxone 8 mg-2 mg sl film  7 7 Gabriel Rainwater MD [facility]   Others' Prescriptions  Patient Name: Jakaleb Payer Birth Date: 03-04-1985   Address: [address] [place], [place] [address] Sex: Male   Rx Written Rx Dispensed Drug Quantity Days Supply Prescriber Name Payment Method Dispenser   10/25/2017 10/26/2017 buprenorphine-naloxone 8-2 mg sl film  7 7 Andersen-Buescher, Eve [facility]   10/16/2017 10/18/2017 buprenorphine-naloxone 8-2 mg sl film  7 7 Andersen-Buescher, Eve [facility]   09/27/2017 10/04/2017 buprenorphine-naloxone 8-2 mg sl film  7 7 Andersen-Buescher, Eve [facility]   09/19/2017 09/21/2017 buprenorphine-naloxone 8-2 mg sl film  7 7 Andersen-Buescher, Eve [facility]   09/06/2017 09/06/2017 suboxone 8 mg-2 mg sl film  7 7 Andersen-Buescher, Eve [facility]   08/01/2017 08/03/2017 suboxone 8 mg-2 mg sl film  7 7 Andersen-Buescher, Eve [facility]   07/23/2017 07/26/2017 buprenorphine-naloxone 8-2 mg sl film  7 7 Andersen-Buescher, Eve [facility]   07/09/2017 07/11/2017 buprenorphine-naloxone 8-2 mg sl film  7 7 Andersen-Buescher, Eve [facility]   06/28/2017 06/29/2017 buprenorphine-naloxone 8-2 mg sl film  7 7 Andersen-Buescher, Eve [facility]   06/14/2017 06/15/2017 buprenorphine-naloxone 8-2 mg sl film  7 7 Andersen-Buescher, Eve [facility]   06/07/2017 06/08/2017 suboxone 8 mg-2 mg sl film  7 7 Andersen-Buescher, Eve [facility]   05/25/2017 05/28/2017 buprenorphine-naloxone 8-2 mg sl film  7 7 Andersen-Buescher, Eve [facility]   05/17/2017 05/21/2017 buprenorphine-naloxone 8-2 mg sl film  7 7 Andersen-Buescher, Eve [facility]   04/26/2017 04/27/2017 buprenorphine-naloxone 8-2 mg sl film  7 7  Andersen-Buescher, Eve [facility]   04/19/2017 04/20/2017 buprenorphine-naloxone 8-2 mg sl film  7 7 Andersen-Buescher, Eve [facility]   04/13/2017 04/13/2017 suboxone 8 mg-2 mg sl film  7 7 Andersen-Buescher, Eve [facility]   04/05/2017 04/06/2017 suboxone 8 mg-2 mg sl film  7 7 Billy Fischerndersen-Buescher, Eve [facility]   03/29/2017 03/29/2017 suboxone 8 mg-2 mg sl film  7 7 Billy Fischerndersen-Buescher, Eve [facility]   03/28/2017 03/28/2017 suboxone 8 mg-2 mg sl film  1 1 Andersen-Buescher, Eve [facility]   03/27/2017 03/27/2017 suboxone 8 mg-2 mg sl film  1 1 Andersen-Buescher, [facility]       7. Collaboration between nursing, counseling staff, and the providers maintained through the process for support to [employer].    Last reported use by patient is 2-3 bowls of THC yesterday evening , denies use of all other illicit substances since last appointment yesterday which includes street Suboxone/Methadone.    1st Suboxone dose   12 mg administered by Raymond GurneyGena Mose Colaizzi, RN   at [phone]     Reassessment of COWS at 1020 was Opiate Withdrawal Score: 0        Raymond GurneyGena Esperanza Madrazo, RN

## 2018-01-10 NOTE — Progress Notes (Signed)
Ancillary Withdrawal Services Provider Daily Note     Chief Complaint/Reason for Encounter: Stabilization day 2    New medical issues since last visit: None    Substance use since last visit: no    Subjective report of symptoms: The patient denied any side effects but did feel the suboxone wearing off at night. Denies any use     Review of Systems  Pertinent items are noted in HPI.    Visit Vitals  BP 129/73 (BP Location: Right arm, Patient Position: Sitting, Cuff Size: adult)   Pulse 65   Ht 1.829 m (6')   Wt 125.2 kg (276 lb)   BMI 37.43 kg/m       Current COWS: Complete Assessment (From Documentation Flowsheet) - COWS    Resting Pulse Rate: Pulse rate 80 or below  Sweating: No report of chills or flushing  Restlessness: Able to sit still  Pupil Size: Pupils pinned or normal size for room light  Bone or Joint Aches: Not present  Runny Nose or Tearing: Nose running or tearing  GI Upset: Stomach cramps  Tremors: No tremor  Yawning: No yawning  Anxiety or Irritabilitiy: None  Gooseflesh Skin: Skin is smooth  Opiate Withdrawal Score: 3    Current CIWA: Complete Assessment (From Documentation Flowsheet) - CIWA-AR                                       General Physical Assessment  General Appearance: alert  Eyes: negative  Extremities: extremities normal, atraumatic, no cyanosis or edema  Skin: Skin color, texture, turgor normal. No rashes or lesions    ASSESSMENT OF RISK FOR SUICIDAL BEHAVIOR  Changes in risk for suicide from baseline Formulation of Risk and/or previous intake, including newly identified risk, if any: none    ASSESSMENT OF RISK FOR VIOLENT BEHAVIOR  Changes in risk for violence from baseline Formulation of Risk and/or previous intake, including newly identified risk, if any: none    MEDICAL DECISION MAKING  32 year old male with opoid use disorder who presented to stabilization program. He has been able to tolerate 6 mg of suboxone and will increase it to 8 mg . No acute safety concerns      Diagnoses:  Opioid Use disorder,severe   Counseling/Coordination of Care:  Diagnostic results/impressions and/or recommended studies were discussed  Instruction for detox treatment/follow-up discussed  Risks and benefits of treatment options discussed  Education provided to patient/family/caregiver  Side effects and benefits of his medications were discussed   Side effect management discussed  Adjustments of medication timing discussed  Risk factor reduction discussed  Prognosis discussed  Importance of adherence to treatment regimen discussed  Relapse prevention discussed  Tobacco Dependence discussed  If BMI is elevated, patient was referred to PCP      Plan:  Continue suboxone 8 mg   Continue trazodone 50 mg prn   continue clonidine 0.1 mg Bid prn     Length of Visit: 20 min's   Greater than 50% of face to face time was spent providing counseling and/or coordination of care.

## 2018-01-10 NOTE — Progress Notes (Signed)
Ancillary Withdrawal Services Provider Daily Note     Chief Complaint/Reason for Encounter: Stabilization day 3    New medical issues since last visit: None    Substance use since last visit: no    Subjective report of symptoms: The patient reported that he used marijuana last night to unwind. Psycho education was provided about side effects of marijuana use. Otherwise doing well and denies side effects of current medication regimen     Review of Systems  Pertinent items are noted in HPI.    Visit Vitals  BP 136/84 (BP Location: Right arm, Patient Position: Sitting)   Pulse 73       Current COWS: Complete Assessment (From Documentation Flowsheet) - COWS    Resting Pulse Rate: Pulse rate 80 or below  Sweating: No report of chills or flushing  Restlessness: Able to sit still  Pupil Size: Pupils pinned or normal size for room light  Bone or Joint Aches: Not present  Runny Nose or Tearing: Not present  GI Upset: No GI symptoms  Tremors: No tremor  Yawning: No yawning  Anxiety or Irritabilitiy: None  Gooseflesh Skin: Skin is smooth  Opiate Withdrawal Score: 0    Current CIWA: Complete Assessment (From Documentation Flowsheet) - CIWA-AR                                       General Physical Assessment  General Appearance: alert  Eyes: negative  Extremities: extremities normal, atraumatic, no cyanosis or edema  Skin: Skin color, texture, turgor normal. No rashes or lesions    ASSESSMENT OF RISK FOR SUICIDAL BEHAVIOR  Changes in risk for suicide from baseline Formulation of Risk and/or previous intake, including newly identified risk, if any: none    ASSESSMENT OF RISK FOR VIOLENT BEHAVIOR  Changes in risk for violence from baseline Formulation of Risk and/or previous intake, including newly identified risk, if any: none    MEDICAL DECISION MAKING  32 year old male with opoid use disorder who presented to stabilization program. He has been able to tolerate 8 mg of suboxone and will increase it to 12 mg . psycho education  about marijuana use provided. Goals of the patient were discussed and he denies any acute safety concerns     Diagnoses:  Opioid Use disorder,severe   Counseling/Coordination of Care:  Diagnostic results/impressions and/or recommended studies were discussed  Instruction for detox treatment/follow-up discussed  Risks and benefits of treatment options discussed  Education provided to patient/family/caregiver  Side effects and benefits of his medications were discussed   Side effect management discussed  Adjustments of medication timing discussed  Risk factor reduction discussed  Prognosis discussed  Importance of adherence to treatment regimen discussed  Relapse prevention discussed  Tobacco Dependence discussed  If BMI is elevated, patient was referred to PCP      Plan:  Continue suboxone 12 mg   Continue trazodone 50 mg prn   continue clonidine 0.1 mg Bid prn     Length of Visit: 20 min's   Greater than 50% of face to face time was spent providing counseling and/or coordination of care.

## 2018-01-10 NOTE — BH Intake Assessment (Addendum)
[facility] Initial Assessment     Date of Service: 01-10-18  Duration:  40 minutes       Identifying Data:  Age: 32 y.o.  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 No     Domestic Violence:  Patient and/or identification tool has not identified the presence of domestic violence at this time.    HPI:  (Including onset of symptoms, severity of symptoms, and circumstances leading to seeking treatment)    Taken from Initial Intake on 02-27-17:  Opiates   Age of first use: 18  Progression of use: Pt reported beginning opiate use at age 57, starting to use oxycotin. Pt reported, " I was hanging out with people who did a lot of drugs". Pt reported that he was using only on weekends in the beginning, using 72m-80mg pills. Pt reported, "I didn't even like using at first because I would get sick. But then I just got used to it". Pt reported that he began using via inhalation. Pt reported, "I used it as a party drug". Pt stated that at age [age]he had knee surgery and was prescribed percocet 535m He reported that he took it orally for about 2 months and "I was calling my doctor to refill my prescription all the time". Pt reported, "this brought on my addiction I think". Pt reported that  once he was no longer prescribed percocet he began using Vicodin and morphine as well as percocet from the street. Pt reported that he used opiates in addition to benzos "off and on" until age [age]Pt reported that he attempted to quit use and from age [age] he had 1.5 years of sobriety from opiates. Pt reported that his girlfriend at the time broke up with him and he felt as though he was going to relapse due to feeling depressed so he referred himself to treatment. Pt reported that at age [age] had surgery on his other knee, and he was prescribed percocet again. Pt reported, "I never told the doctor I was an addict". Pt reported he was prescribed percocet for about 2-3 months and when this ran out he began using Roxy. Pt reported, "I snorted Roxy as often as I could". Pt stated that he would buy 8-10 pills at a time and that would last him about 2 days. Pt reported at this time he was using daily. Pt reported that he would feel "dope sick" when not using" Pt stated, "this has been my life the past 5 years". Pt reported that his current girlfriend is not aware of his opiate addiction. He identified that if she were to learn about it there would be consequences for their relationship.  that his use as also impacted him job. Pt stated however that he feels he is more productive while using and often goes to work high. Pt reported that he frequently drives while under the influence as well. Pt stated, "I only keep as many pills in the car as I know I can eat if I get pulled over".  Current Use Pattern as of 02-27-17: Pt reported that he currently used percocet 7.5 mg pills. Pt reported that he gets as many pills as he can afford as often as he can. Pt reported that he typically buys 20-30 pills per week depending on his finances. Pt stated that his dealer has told him that he needs to "cut down and let others buy from him". Pt stated that since moving to [place] from [place] over the past year he has noticed an increase  in use as he has an increase in his income. Pt reported that he is spending about $300 per week on pills. He stated that last week he snorted 14 pills in one day, which is the most he has ever used. Pt reported that his LU was 02/26/17,when he snorted 4 percocet pills.       Update on 01-10-18:  Judea engaged in treatment at [facility] and was prescribed suboxone by Eve Andersen-Buescher and things were going well. For about 2-3 months, "doing everything right", attending group, and attending all my appointments. Daxtyn reports being abstinent from opioids for 2-3 months but was continuing to use marijuana. Gillis dicussed his relapsed was triggered by a vacation to [place] with some friends for a baseball trip. Raymond discussed once he relapsed use pain pills, he continued to have more frequent uses of opioids. Wataru reported he would take his suboxone to keep from being sick, but would also continue to use illicitly. Around this time his relationship was also impacted by his use and had a break-up. Case discussed this also led him to drinking alcohol more frequently. Jameek requested to leave treatment and he last received services at [facility] on 11-22-17. For the past month and a half, Dameon has been using Percocet daily, using around $200 per week. Matan's use continued to increase with his tolerance as his use progressed to taking pain pills every 2-3 hours, taking around 8-10 pills per day. Ayuub last reports taking pain pills on Monday 01-08-18, taking 6-8 pills.     Deny reports daily use of marijuana, smoking $10 per day, smoking a couple bowls per day. Camauri also reports occasional alcohol use, drinking 1x per week. Edel discussed his alcohol use has decreased since being engaged at [facility] and reports his last use on Saturday 01-05-18. Kenechukwu also reports sporadic use of benzodiazepines. Jaymen recently used benzodiazepines on his drive back home from [place] to [place]. Saivion discussed he had run out of pain pills and he could only access benzos. Dakwon denies any consistent use of benzodiazepines.       Zyiere discussed he is interested in re-engaging at [facility] because "I no longer want to be dependent on an illicit substance, and overall I want to be healthy".     Physical Health:         -- Is patient now in any physical pain? no       -- Is patient in withdrawal? no, received suboxone this morning (12mg)       -- IStop database has been checked       -- BAC   BAC result is:  0.000       -- Urine Toxicology Screen is:  Pending    Visual/Motor functioning (based on your observation):  No observed deficits in hand-eye coordination or gait    Patient Psychiatric, Legal History:    Psychiatric:    ED Visits: 10-08-17: was intoxicated after argument with friend and was brought to [place] Arrests: Denies  Are you currently receiving mental health care? Denies  Are you currently prescribed any psychiatric medications? Denies    Legal:  Pending legal charges or sentencing: No  Will legal status be a barrier to entering and remaining in treatment?: N/A     Have you ever been charged and convicted of a sexual offense? No  Do you currently have charges pending? No  What were the circumstances of the charge? N/A  Do you have restrictions? No If yes, what are these? N/A  Are you required to register? No If yes, are you updated with the registry? No    (Please complete the LOCADTR with the patient)     Mental Status Exam:  MMS screen: MMS Total Score: 8   PHQ-9 Score: 11  APPEARANCE: Appears stated age, Casual  ATTITUDE TOWARD INTERVIEWER: Cooperative  MOTOR ACTIVITY: WNL (within normal limits)  EYE CONTACT: Direct  SPEECH: Normal rate and tone and Age appropriate  AFFECT: Full Range  MOOD: Neutral  THOUGHT PROCESS: Normal  THOUGHT CONTENT: No unusual themes  PERCEPTION: Within normal limits  CURRENT SUICIDAL IDEATION: patient denies  CURRENT HOMICIDAL  IDEATION: Patient denies  ORIENTATION: Alert and Oriented X 3.  CONCENTRATION: WNL  MEMORY:   Recent: intact   Remote: intact  COGNITIVE FUNCTION: Average intelligence  JUDGMENT: Intact  IMPULSE CONTROL: Fair  INSIGHT: Age appropriate     Assessment of Risk For Suicidal Behavior:  The items prior to Risk Formulation and Summary in this assessment can guide the collection of relevant risk-related information. These data inform the Risk Formulation and Summary, which is the primary focus of this assessment. Be sure to document the rationale (reasoning) behind your clinical judgment of risk.     [place] Scale administered? N/A    Predisposing Vulnerabilities:  Chronic substance abuse    Non-Suicidal Self-Injury:  None reported by patient.     Recent Stressful Life Event(s):  Patient had a recent significant break up with SO     Clinical Presentation:  Impulsivity, recklessness (incl. subjective feeling "out of control"), Mania/hypomania, Alcohol or drug intoxication     Access to Lethal Means (weapons/firearms, medications, other):  Yes - but states they are locked up and in a safe     Opportunities for Crisis and Treatment Planning/Protective Factors:  Able to identify reasons for living, Does not view suicide as a personal option, Good physical health, Hopefulness, Perceived reasons to live are greater than reasons to die, Active engagement in treatment, Supportive relationships, Future oriented     Engagement and Reliability:  Engagement with attempts to interview/help: good   Assessment of reliability of report: good   Additional details or comments: Osias was attentive and appropriate and assumed to be truthful when discussing substance use history as he was forthcoming about his use    Suicide Risk Formulation and Summary:   Synthesize information gathered into an overall judgment of risk.     Overall Clinical Judgment of Risk: (Indicate your judgment of this individual's long and  short term risk)   - Long-term./Chronic Risk: Low   -  Short-term/Acute Risk: Low     Synthesis and Rationale for Clinical Judgment of Risk: Describe: Patient denies any current suicidal ideation, intent or plan to harm self and denies any past history of self-harming behaviors. Patient presents with several protective factors including being hopeful and future oriented, having supportive relationships, has overall good physical health, and able to identify reasons for living. Patient updated his safety plan in chart. There are no imminent risks or concerns at this time for self-harm.      - Plan: Monitoring beyond usual for suicide risk not indicated at this time.       Assessment of Risk For Violent Behavior:     Current violence ideation: No  Current violence intent: No  Current violence plan: No  Recent (within past 8 weeks) violent or threatening thoughts or behaviors: No  Prior history of any violent or threatening behavior toward others: Yes , pt states he has been in a verbal altercation in the past but denies any physical violence  Prior legal involvement (family, civil, or criminal) related to threatening or violent behavior: No, but does reports there was a restraining order involved due to past harassng behaviors toward's ex-girlfriend  Current involvement in a protection order proceeding: No  History of destruction to property: No; If yes, most recent date: No     Violence Risk Formulation and Summary:  Synthesize information gathered into an overall judgment of risk.     Overall Clinical Judgment of Risk (indicate your judgment of this individual's long and short-term risk):    - Long-term./Chronic Risk: Low/Moderate   - Short-term/Acute Risk: Low/Moderate     Synthesis and Rationale for Clinical Judgment of Risk: Describe: Patient denies any violence ideation, intent or plan to harm others but does report a past event where he had a restraining  order due to harassing behaviors. Patient denies any legal involvement regarding the situation and reports the act was non-violent.      - Plan: Monitoring beyond usual for violence risk not indicated at this time.     CRITERIA FOR SUBSTANCE USE DISORDER   Patient meets criteria for substance use disorder by exhibiting the following symptoms within a 27-monthperiod:  1. Opioids is often taken in larger amounts or over a longer period than was intended.  2. There is a persistent desire or unsuccessful efforts to cut down or control opioids use.  3. A great deal of time is spent in activities necessary to obtain opioids, use opioids or recovery from its effects.  4. Craving, or a strong desire or urge to use opioids.  5. Recurrent opioids use resulting in a failure to fulfill major role obligations at work, school, or home.  6. Continued opioids use despite having persistent or recurrent social or interpersonal problems caused or exacerbated by the effects of the opioids.  7. Important social, occupational, or recreational activities are given up or reduced because of opioids use.  8. Recurrent opioids use in situations in which it is physically hazardous.  9. Opioid use is continued despite knowledge of having a persistent or recurrent physical or psychological problem that is likely to have been caused or exacerbated by opioids.  10. Tolerance, as defined by either of the following: a. A need for markedly increased amounts of opioids to achieve intoxication or desired effect.  11. Withdrawal, as manifested by either of the following: b. Opioids (or a closely related opioid) is taken to relieve or avoid withdrawal symptoms.    Working  ICD-10-CM ICD-9-CM   1. Opioid use disorder, severe, dependence F11.20 304.00        Opioid Use Risk Assessment  Is the person actively using opiates? Yes  Has the person ever met criteria for opioid use disorder? Yes  Was the patient trained and provided  with Narcan? Declined, states he has one at home  Was collateral contacted to discuss safety and use of Narcan? No  Was Medication Assisted Treatment discussed during the session? Yes patient is interested in suboxone and is currently engaged in the ancillary stabilization program   Do you currently use street methadone? No    Plan:  Patient will be admitted to [facility].  Treatment recommendations include: individual psychotherapy (CBT, PST, DBT, CPT, IPT), group psychotherapy and medication management.     Faizon will continue to engage in the ancillary stabilization program coming daily to stabilize on suboxone. Amedee discussed he is interested in continuing his treatment into an outpatient level and requested the evening program due to working. Dominyk discussed he works from 8am-430pm. Writer asked if Hulen could attend a 730-9am group, but states he has to be at work by 830am.      Mesiah will complete his PCS while in the stabilization program and will continue to be assessed on a transition date to outpatient pending use and how suboxone is working.     I have reviewed this intake and am in agreement with its findings.    Karen Hospers, MS, CASAC

## 2018-01-11 ENCOUNTER — Encounter: Payer: Self-pay | Admitting: Gastroenterology

## 2018-01-11 ENCOUNTER — Ambulatory Visit: Payer: PRIVATE HEALTH INSURANCE | Admitting: Psychiatry

## 2018-01-11 VITALS — BP 131/80 | HR 63

## 2018-01-11 DIAGNOSIS — F1120 Opioid dependence, uncomplicated: Secondary | ICD-10-CM

## 2018-01-11 DIAGNOSIS — F1220 Cannabis dependence, uncomplicated: Secondary | ICD-10-CM

## 2018-01-11 LAB — POCT URINE DRUG SCREEN 12 PANEL
AMP (1000ng/mL): NEGATIVE
BAR (300ng/mL): NEGATIVE ng/mL
BZD (300 ng/mL): NEGATIVE
COC (300ng/mL): NEGATIVE
MDMA (500ng/mL): NEGATIVE
MET (1000ng/mL): NEGATIVE
MTD (300ng/mL): NEGATIVE
OPI (300ng/mL): NEGATIVE
OXY (100ng/mL): POSITIVE
PCP (25ng/mL): NEGATIVE
TCA (1000ng/mL): NEGATIVE
THC (50ng/mL): POSITIVE

## 2018-01-11 NOTE — Progress Notes (Signed)
Ancillary Withdrawal Services Provider Daily Note     Chief Complaint/Reason for Encounter: Stabilization program Day 4    New medical issues since last visit: denies    Substance use since last visit: reports using two oxys and also smoked some marijuana    Subjective report of symptoms: Feels that 12 mg worked better than 8 mg, feels that it wore off last evening and that is when he used the two oxys which had been given to him during the day by a co-worker - states that he struggles doing anything in life without being high .    Review of Systems  Pertinent items are noted in HPI.    Visit Vitals  BP 138/81 (BP Location: Right arm, Patient Position: Sitting, Cuff Size: adult)   Pulse 65       Current COWS: Complete Assessment (From Documentation Flowsheet) - COWS    Resting Pulse Rate: Pulse rate 80 or below  Sweating: No report of chills or flushing  Restlessness: Able to sit still  Pupil Size: Pupils pinned or normal size for room light  Bone or Joint Aches: Mild diffuse discomfort  Runny Nose or Tearing: Nasal stuffiness or unusually moist eyes  GI Upset: No GI symptoms  Tremors: No tremor  Yawning: No yawning  Anxiety or Irritabilitiy: Patient reports increasing irritability or anxiousness  Gooseflesh Skin: Skin is smooth  Opiate Withdrawal Score: 3      General Physical Assessment  General Appearance: alert  Eyes: conjunctivae/corneas clear. PERRL, EOM's intact. Fundi benign.  Extremities: extremities normal, atraumatic, no cyanosis or edema  Skin: Skin color, texture, turgor normal. No rashes or lesions    ASSESSMENT OF RISK FOR SUICIDAL BEHAVIOR  Changes in risk for suicide from baseline Formulation of Risk and/or previous intake, including newly identified risk, if any: none    ASSESSMENT OF RISK FOR VIOLENT BEHAVIOR  Changes in risk for violence from baseline Formulation of Risk and/or previous intake, including newly identified risk, if any: none    MEDICAL DECISION MAKING    Diagnoses:    ICD-10-CM  ICD-9-CM   1. Opioid use disorder, severe, dependence F11.20 304.00   2. Cannabis use disorder, moderate, dependence F12.20 304.30     Patient is a 32 year old Caucasian male with opiate, cannabis and alcohol use disorder who presents for Day 4 of Stabilization program.  Discussed risk versus benefit of suboxone today as he used oxys last evening - agreeable to a test dose of 2 mg suboxone which was observed by RN. Also spent time discussing the physical versus mental aspects of addiction and he stated he was well aware that mentally he struggles with going through life not ' high' and is unsure how he wants to address this - quite ambivalent about inpatient as he dos not want to lose his job.  Denies any thoughts of self harm.        Counseling/Coordination of Care:  Diagnostic results/impressions and/or recommended studies were discussed  Instruction for detox treatment/follow-up discussed  Risks and benefits of treatment options discussed  Education provided to patient/family/caregiver  Risk factor reduction discussed  Prognosis discussed  Importance of adherence to treatment regimen discussed  Relapse prevention discussed     Plan:  Given 2 mg suboxone, observed for precipitated withdrawal by RN - did not have any, given an additional 10 mg suboxone today and  2 - 12 mg take homes for the weekend    Return visit: 3 days  Length of Visit:  Duration:  22 minutes    Greater than 50% of face to face time was spent providing counseling and/or coordination of care.

## 2018-01-11 NOTE — BH Intake Assessment (Signed)
Level of Care Determination     PRELIMINARY ASSESSMENT  Client Substance with most potential to cause harm:  Opioids    DIAGNOSIS  The client meets 11 criteria for a severe substance use disorder  The client meets criteria for tolerance and/or withdrawal.     CRISIS DETOX  Does the person have serious psychiatric or medical symptoms that would require 24-hour  inpatient medical management in a hospital setting where a physician is in attendance daily?  a.Vital signs need to be monitored at least every 6 hours.  b.Detoxification medication are needed to prevent or modify withdrawal and the client needs to be  monitored to adjust medication.    Please check all that apply. Note at least one of these concerns must exist to meet the level of  care threshold for 24 hour inpatient medically managed detox.  h. None of these apply.     The person requires medical supervision and stabilization in an inpatient setting with 24-hour  medical oversight based on which of the following:  None of these apply.    RESOURCES  The following are available resources endorsed during the interview.  Performing responsibilities in their work, social or family roles.  Strong self-efficacy or confidence that s/he can pursue recovery goals.  Connection to a social or family network.  Therapeutic alliance with at least one professional helper.  Person has stable access to food and shelter.    LEVEL OF CARE RECOMMENDED BY LOCADTR  Outpatient Treatment with Medication Assisted Treatment (MAT)  An OASAS-certified outpatient clinic that is also certified to prescribe and monitor addiction  medications including buprenorphine, naltrexone, alcamprosate, disulfiram, and others. OASAScertified outpatient services have multi-disciplinary teams that include medical staff and a medical  director. These programs provide the following procedures: group and individual counseling; education  about, orientation to, and opportunity for participation in,  relevant and available self-help groups;  alcohol and substance abuse disease awareness and relapse prevention; HIV and other  communicable disease education, risk assessment, supportive counseling and referral; and family  treatment. In addition, social and health care services, skill development in accessing community  services, activity therapies, information and education about nutritional requirements, and vocational  and educational evaluation must be available either directly or through written agreements. Procedures  are provided according to an individualized assessment and treatment plan.    Ancillary Withdrawal Service (outpatient)  Ancillary withdrawal services are the medical management of mild or moderate symptoms of  withdrawal within an OASAS-certified outpatient clinic setting. Medical staff monitor withdrawal  symptoms. Providers must have a protocol for providing ancillary withdrawal services approved by the  OASAS Medical Director.    NEED TO OVERRIDE LOCADTR RECOMMENDATION  There is a need to override LOCADTR recommendation for the following reason:  Not applicable    FINAL RECOMMENDED LEVEL OF CARE  Outpatient Treatment with Medication Assisted Treatment (MAT)  An OASAS-certified outpatient clinic that is also certified to prescribe and monitor addiction  medications including buprenorphine, naltrexone, alcamprosate, disulfiram, and others. OASAScertified outpatient services have multi-disciplinary teams that include medical staff and a medical  director. These programs provide the following procedures: group and individual counseling; education  about, orientation to, and opportunity for participation in, relevant and available self-help groups;  alcohol and substance abuse disease awareness and relapse prevention; HIV and other  communicable disease education, risk assessment, supportive counseling and referral; and family  treatment. In addition, social and health care services, skill  development in accessing community  services, activity therapies,   information and education about nutritional requirements, and vocational  and educational evaluation must be available either directly or through written agreements. Procedures  are provided according to an individualized assessment and treatment plan.    Ancillary Withdrawal Service (outpatient)  Ancillary withdrawal services are the medical management of mild or moderate symptoms of  withdrawal within an OASAS-certified outpatient clinic setting. Medical staff monitor withdrawal  symptoms. Providers must have a protocol for providing ancillary withdrawal services approved by the  OASAS Medical Director.

## 2018-01-11 NOTE — Progress Notes (Signed)
Ancillary Withdrawal Services Individual Progress Note     LENGTH OF SESSION: 10 minutes    Contact Type:  Location: On Site    Brief Face to Face     Last Toxicology Screen Date: 01-11-18    Last reported/recorded use: 01-10-18, took 2 percocet 40m around 7pm      Treatment/Recovery Goals Addressed:    Treatment Problem #1 04/11/2017   1ST TREATMENT PLAN PROBLEM JTaedynhas struggles to abstain from use of substances, which has created negative consequences in his life.     Treatment Goal #1 04/11/2017   1ST TREATMENT PLAN GOAL JAdinwill abstain from all substances and develop a lifestyle of recovery.     Treatment Problem #2 04/11/2017   2ND TREATMENT PLAN PROBLEM JBretthas a history of mental health and is concerned about the impact it has had on his use and relationships.     Treatment Goal #2 04/11/2017   2ND TREATMENT PLAN GOAL JSherrickwill develop an awareness of his mental health and the develop skills to cope without using substances.      Treatment Problem #3 09/13/2017   3RD TREATMENT PLAN PROBLEM JQuamainehas limited support from family members.     Treatment Goal #3 09/13/2017   3RD TREATMENT PLAN GOAL JMccartneywill further engage his family in his recovery.     Treatment Problem #4 09/13/2017   4TH TREATMENT PLAN PROBLEM JRhoneis struggling with the break up of his relationship and has a chronic history of substance use impacting them.     Treatment Goal #4 09/13/2017   4TH TREATMENT PLAN GOAL JAniketwill develop healthy coping skills to process his break up and further assess his pattern of relationships.       Risk Evaluation:     Risk (suicidal or homicidal)     Any thoughts of harming yourself or others?  Self Injurious Behavior: Patient Denies, Suicidal Ideation: Patient Denies, Homicidal Ideation: Patient Denies, Aggressive Behavior: Patient Denies      Session Content  Treatment plan developed with patient    Evidence-based or Clinical Intervention Used:  Relapse Prevention  Medication Supported  Recovery  Individual Drug Counseling    Session summary/response to session: WProbation officermet with JAnthemas he continues to engage in the ancillary stabilization program. JCeejaydiscussed he did take 2-561mpercocet last night at 7pm after a friend from work offered him pills. JuBhargaviscussed it is "close to impossible" to deny receiving pain pills. Writer discussed strategies to avoid being handed pain pills and JuAlvatates he will try to avoid him so he does not get offered any Percocet. Writer also used motivational interviewing to consider other options to not get his hands on other pain pills. JuAmontaelso reports smoking marijuana last night. Writer also reviewed safety plan with JuFawzind updated information and was able to provide a signature. JuZafarenies having any plans for the weekend and is just going to relax. Writer had discussed coping strategies if he is starting to feeling cravings and/or urges to use and JuEulastates he will try to distract himself. JuPericlesill continue to engage in the ancillary stabilization program, returning on Monday 01-14-18 at 830am.     Plan  Continue Treatment as planned.    Current Treatment Plan   Created/Updated On 09/13/2017   Next Treatment Plan Due 12/12/2017          (Please complete the Domestic Abuse and Neglect screening question on the Screening Navigator during treatment  plan review session)

## 2018-01-11 NOTE — BH Crisis Intervention (Addendum)
[facility] Safety/Relapse Prevention Plan     Things that cause you to feel stressed or put you at risk for using:  arguments, being isolated/alone, feeling lonely, feeling worried, not being listened to and not getting own way    Warning signs:  breathing hard, can't sit still, isolating/avoiding behavior, lying, not taking care of self, racing heart, rudeness, swearing, sweating/racing heartbeat/breathing hard and wringing hands    Coping strategies:  being around others, doing chores/special jobs, exercising, taking a shower, talking to friends/family and using humor, working, taking medications    Additional sober activities to engage in during stressful times.  1.  Going to the gym    What self-help meetings/other support groups will you attend?  1.  None    People whom I can ask for help:  Family member: Mother; phone: N/A    Keeping a safe environment:  access to Narcan kit    What will be the consequences for you if you relapse?  1.  Money  2. Job issues    Professionals or agencies I can contact during a crisis:  Clinician Name: Magdalen Spatz; Phone: [phone]    Clinician Emergency Contact number/Answering Service numbers:   [phone] (day time)   [phone] (after hours answering service)     [place]: [phone] or [place]: [phone]  [place] PHONE: [phone]  POLICE: [id number]    IF NEEDED, GO TO [facility]      Patient Signature: ______________________________________________________      Clinician Signature: _____________________________________________________

## 2018-01-11 NOTE — Addendum Note (Signed)
Addended byRaymond Gurney: Jakie Debow on: 01/11/2018 10:10 AM     Modules accepted: Orders

## 2018-01-11 NOTE — Progress Notes (Signed)
Ronald Richard Grandstaff is a 32 y.o. male was seen today for Day 4 of the Ancillary Withdrawal Program. he has been assessed by nursing and the following items have been completed:     1. Rapid Urine toxicology was obtained, results see POCT.e  2. Urine sent for confirmation: Yes, positive for oxycodone   3. BAC negative  4. COWS Assessment  Complete Assessment (From Documentation Flowsheet) - COWS    Resting Pulse Rate: Pulse rate 80 or below  Sweating: No report of chills or flushing  Restlessness: Able to sit still  Pupil Size: Pupils pinned or normal size for room light  Bone or Joint Aches: Mild diffuse discomfort  Runny Nose or Tearing: Nasal stuffiness or unusually moist eyes  GI Upset: No GI symptoms  Tremors: No tremor  Yawning: No yawning  Anxiety or Irritabilitiy: Patient reports increasing irritability or anxiousness  Gooseflesh Skin: Skin is smooth  Opiate Withdrawal Score: 3  5. I-Stop data:     Ronald Richard Forse - 1622 Prescriptions  Confidential Drug Report  Search Terms: Ronald Richard Rullo, 1985-10-29   Search Date: 01/11/2018 09:58:49 AM   Searching on behalf of: [id number] - Gabriel RainwaterGloria J Baciewicz   The Drug Utilization Report below displays all of the controlled substance prescriptions, if any, that your patient has filled in the last twelve months. The information displayed on this report is compiled from pharmacy submissions to the Department, and accurately reflects the information as submitted by the pharmacies.  This report was requested by: Raymond GurneyGena Naylani Bradner   Reference #: [id number]   Almetta LovelyGloria J Baciewicz's Prescriptions  Patient Name: Ronald Richard Bunnell Birth Date: 1985-10-29   Address: [address] [place], [place] [id number] Sex: Male   Rx Written Rx Dispensed Drug Quantity Days Supply Prescriber Name Payment Method Dispenser   09/13/2017 09/14/2017 buprenorphine-naloxone 8-2 mg sl film  7 7 Gabriel RainwaterBaciewicz, Gloria J MD Insurance [facility]   05/08/2017 05/10/2017 suboxone 8 mg-2 mg sl film  7 7 Gabriel RainwaterBaciewicz, Gloria J MD  Insurance [facility]   Others' Prescriptions  Patient Name: Ronald Richard Guthmiller Birth Date: 1985-10-29   Address: [address] [place], [place] [id number] Sex: Male   Rx Written Rx Dispensed Drug Quantity Days Supply Prescriber Name Payment Method Dispenser   10/25/2017 10/26/2017 buprenorphine-naloxone 8-2 mg sl film  7 7 Andersen-Buescher, Eve Insurance [facility]   10/16/2017 10/18/2017 buprenorphine-naloxone 8-2 mg sl film  7 7 Andersen-Buescher, Eve Insurance [facility]   09/27/2017 10/04/2017 buprenorphine-naloxone 8-2 mg sl film  7 7 Andersen-Buescher, Eve Insurance [facility]   09/19/2017 09/21/2017 buprenorphine-naloxone 8-2 mg sl film  7 7 Andersen-Buescher, Eve Insurance [facility]   09/06/2017 09/06/2017 suboxone 8 mg-2 mg sl film  7 7 Andersen-Buescher, Eve Insurance [facility]   08/01/2017 08/03/2017 suboxone 8 mg-2 mg sl film  7 7 Andersen-Buescher, Eve Insurance [facility]   07/23/2017 07/26/2017 buprenorphine-naloxone 8-2 mg sl film  7 7 Andersen-Buescher, Eve Insurance [facility]   07/09/2017 07/11/2017 buprenorphine-naloxone 8-2 mg sl film  7 7 Andersen-Buescher, Eve Insurance [facility]   06/28/2017 06/29/2017 buprenorphine-naloxone 8-2 mg sl film  7 7 Andersen-Buescher, Eve Insurance [facility]   06/14/2017 06/15/2017 buprenorphine-naloxone 8-2 mg sl film  7 7 Andersen-Buescher, Eve Insurance [facility]   06/07/2017 06/08/2017 suboxone 8 mg-2 mg sl film  7 7 Andersen-Buescher, Eve Insurance [facility]   05/25/2017 05/28/2017 buprenorphine-naloxone 8-2 mg sl film  7 7  Andersen-Buescher, Eve Insurance [facility]   05/17/2017 05/21/2017 buprenorphine-naloxone 8-2 mg sl film  7 7 Andersen-Buescher, Eve Insurance [facility]   04/26/2017 04/27/2017 buprenorphine-naloxone 8-2 mg sl film  7 7 Andersen-Buescher, Eve [facility]   04/19/2017 04/20/2017  buprenorphine-naloxone 8-2 mg sl film  7 7 Andersen-Buescher, Eve [facility]   04/13/2017 04/13/2017 suboxone 8 mg-2 mg sl film  7 7 Andersen-Buescher, Eve Insurance [facility]   04/05/2017 04/06/2017 suboxone 8 mg-2 mg sl film  7 7 Billy Fischer Insurance [facility]   03/29/2017 03/29/2017 suboxone 8 mg-2 mg sl film  7 7 Billy Fischer Insurance [facility]   03/28/2017 03/28/2017 suboxone 8 mg-2 mg sl film  1 1 Andersen-Buescher, Eve Insurance [facility]   03/27/2017 03/27/2017 suboxone 8 mg-2 mg sl film  1 1 Andersen-Buescher, Eve Insurance        6. Collaboration between nursing, counseling staff, and the providers maintained through the process for support to [employer].    Last reported use by patient is 3 bowls of THC which includes street Suboxone/Methadone.    Writer asked Moua about the positive for oxycodone and he stated "yeah I was debating whether or not I was going to tell you to see if I could get away with it". Writer encouraged him to promote integrity and be forthcoming about his use to display accountability, reminded him that writer would remain nonjudgmental about his use.       Did well with 12 mg suboxone yesterday. "I felt like it lasted me all day". States it wore off around 9 pm.     1st Suboxone dose 2 mg administered by Raymond Gurney, RN   at [phone]     Given an additional 10 mg at 1025 for a total dose today of 12 mg.     Reassessment of COWS at 1100 was Opiate Withdrawal Score: 2      Raymond Gurney, RN

## 2018-01-14 ENCOUNTER — Telehealth: Payer: Self-pay

## 2018-01-14 ENCOUNTER — Ambulatory Visit: Payer: PRIVATE HEALTH INSURANCE | Admitting: Psychiatry

## 2018-01-14 DIAGNOSIS — F1120 Opioid dependence, uncomplicated: Secondary | ICD-10-CM

## 2018-01-14 NOTE — Telephone Encounter (Signed)
Writer outreached to pt to follow up on missing his ancillary stabilization appointment. Writer could not reach pt and was not able to leave a vm due to pt's voice mailbox being full.

## 2018-01-14 NOTE — BH Treatment Plan (Signed)
Reviewed by Amerika Nourse, RN

## 2018-01-14 NOTE — BH Treatment Plan (Signed)
[facility] Person-Centered Treatment Plan     Date of Plan:   [facility]:   Created/Updated On 01/14/2018   FROM 01/08/2018      Created/Updated On 01/14/2018   TO 02/07/2018       Service Location: [facility]     Diagnostic Impression    ICD-10-CM ICD-9-CM   1. Opioid use disorder, severe, dependence F11.20 304.00       Patient Strengths and Limitations:   Social/Recreational:  Deferred   Working on physical stabilization at this time  Family:  Deferred   Working on physical stabilization at this time  Physical Health:  Limitation   Ronald Richard is addressing physical opiate withdrawal in stabilization setting  Spiritual:  N/A  Gender Identity:  N/A  Sexual Orientation:  N/A  Education:  N/A  Employment:  N/A  Housing:  N/A  Engineer, materialsCommunity Integration:  N/A  Cultural/Language:  N/A  Outreach Services:  N/A  Other:  N/A  Additional Items:  Mental/Emotional:  Deferred   Ronald Richard will address once stable and completes stabilization  Substance Use:  Active  Legal:  Deferred   Ronald Richard will address once stable and completes stabilization  Problem Gambling:  Deferred   Ronald Richard will address once stable and completes stabilization      Domestic Violence:   Patient and/or identification tool has not identified the presence of domestic violence at this time.  ________________________________________________________________    Substance Use Treatment Goals:  Author: Zeb Comforteresa Eural Holzschuh, BA, CASAC-T    MEDICATION SUPPORTED RECOVERY:  Opioid Replacement Therapy: Buprenorphine    COLLABORATIVE CARE SESSION:N/A    DATE OF LAST PHYSICAL EXAM:  Not Applicable    Treatment Problem #1 01/14/2018   1ST TREATMENT PLAN PROBLEM Ronald Richard has used opiates which has caused consequences in his life.       Treatment Goal #1 01/14/2018   1ST TREATMENT PLAN GOAL Ronald Richard will gain the knowledge and skills to maintain long term sobriety.       The rationale for addressing this problem is that resolving it will (select all that apply):  Reduce symptoms of disorder,  Reduce functional impairment associated with disorder, Decrease likelihood of hospitalization and Facilitate transfer skills learned in therapy to everday life      Progress toward goal(s): N/A - Initial plan    1 a. Measurable Objectives : Ronald Richard  would like to engage in ancillary withdrawal program services as evidenced by attending clinic daily for assessment and medication dispensing.                        Date established: 01-08-18                        Target date: By Treatment Plan Review Date                        Intervention(s): Primary therapist will encourage and monitor attendance         1 b. Measurable Objectives: Ronald Richard is interested in continuing his sobriety as evidenced by [facility] after finishing ancillary withdrawal program.                         Date established: 01-08-18                        Target date: By Treatment Plan Review  Date                        Intervention(s): Therapist will schedule Ronald Richard for orientation to [facility] as well as encourage Ronald Richard to schedule and attend individual sessions with primary therapist.        Treatment Problem #2 01/14/2018   2ND TREATMENT PLAN PROBLEM Ronald Richard is currently experiencing opiate withdrawal symptoms.       Treatment Goal #2 01/14/2018   2ND TREATMENT PLAN GOAL Ronald Richard will be able to address his withdrawal symptoms in a safe manor through ancillary stabilization       The rationale for addressing this problem is that resolving it will (select all that apply):  Reduce symptoms of disorder, Reduce functional impairment associated with disorder and Decrease likelihood of hospitalization    Progress toward goal(s): N/A - Initial plan    2a. Measurable Objectives: Ronald Richard will come daily to clinic and report any physical withdrawal from opiates as evidenced by self-report and nurses and primary provider conducting COWS assessment.                         Date established: 01-08-18                         Target date: By Treatment Plan Review Date                        Intervention(s): Provider will encourage Ronald Richard to report symptoms truthfully and nurses will assist in COWS    _____________________________________________________________________    Plan  TREATMENT MODALITIES:  Individual psychotherapy for 15 min Q daily weeks with Zeb Comfort, BA, CASAC-T  Buprenorphine prescribed by Billy Fischer prescription frequency Q daily weeks frequency of visits Q daily weeks  Primary Care screening yearly.    [facility] PRIMARY CARE SCREEN 03/05/2017   [facility] Primary Care Screen Date 03/05/2017       Patient was provided with written material on the prevention and treatment of communicable diseases, including viral hepatitis, sexually transmitted diseases and HIV/AIDS,  which includes counseling and education on the use of condoms, testing ,and pre- and post-exposure prophylaxis and treatment.    Is the patient a veteran? No    DISCHARGE CRITERIA for this treatment setting:   -Once Ronald Richard is physically stable and receiving a therapeutic dose of suboxone.   -When Ronald Richard no longer wishes to engage in services at [facility].    Has the Safety Plan been created/reviewed for this review period? Yes     Coordination of Care:   PCP: Provider, None, MD  Care Management: N/A  Type of provider(s): N/A  Provider Info (Agency name, contact person/phone number) and Frequency of Contact (weekly, monthly, quarterly, etc.): N/A    Discussion with patient if there is a desire to change advanced directive.  No    Emergency Plan has been reviewed.    Patient/Family Statement  PATIENT/FAMILY STATEMENT:  Obtain patient and family input into the treatment plan, including areas of agreement / disagreement.  Obtain patient's signature - if not possible, briefly describe the reason.     Patient Comments:  N/A    I HAVE PARTICIPATED IN THE DEVELOPMENT OF THIS TREATMENT PLAN AND I AGREE WITH ITS CONTENTS:       Patient Signature:  _______________________________________________________    Date: ______________________________

## 2018-01-15 ENCOUNTER — Telehealth: Payer: Self-pay

## 2018-01-15 NOTE — Telephone Encounter (Signed)
Writer outreached for a 2nd time to pt to follow up on missed appointment from Monday 01-14-18. Writer could not reach pt and could not leave vm due to pt's mailbox being full.

## 2018-01-16 ENCOUNTER — Other Ambulatory Visit: Payer: Self-pay | Admitting: Psychiatry

## 2018-01-16 DIAGNOSIS — F1120 Opioid dependence, uncomplicated: Secondary | ICD-10-CM

## 2018-02-01 DIAGNOSIS — F1120 Opioid dependence, uncomplicated: Secondary | ICD-10-CM

## 2018-02-01 NOTE — BH Discharge Summary (Signed)
[facility] Discharge Summary   (Note to Health Information Management: When releasing this document, please send the document titled [facility] Discharge Plan, if present)      R PSY DATE ADMITTED [facility] 02/01/2018   DATE ADMITTED TO CURRENT [facility] PROGRAM 01/08/2018      R PSY PROGRAM [facility] 02/01/2018   Program [facility] ([facility]): SR Methadone Maintenance   Comments Ancillary Stabilization Program      R PSY REFERRAL SOURCE [facility] 02/01/2018   REFERRAL SOURCE Other   Comments Self      R PSY PRESENTING PROBLEM [facility] 02/01/2018   PRESENTING PROBLEM Opioid Use      R PSY DC DATE [facility] 02/01/2018   DISCHARGE DATE 01/11/2018      R PSY DC TO [facility] 02/01/2018   DISCHARGED TO Self        Discharge Diagnosis    ICD-10-CM ICD-9-CM   1. Opioid use disorder, severe, dependence F11.20 304.00       Summary of Treatment: (interventions,progress, # of visits, response to medications): Ronald Richard attended a total of 4 days engaging in the ancillary stabilization program with suboxone by self-referral. Ronald Richard engaged on Tuesday 01-08-18, expressing interest in starting suboxone to help with his illicit opiate use. Ronald Richard was inducted with suboxone on 01-08-18 and received suboxone all 4 days he attended from 11-19 to 11-22, receiving take home medication for 11-23 and 11-24, for him to return on Monday 01-14-18. Ronald Richard did not attend his 7th day of the ancillary stabilization program on Monday 01-14-18. Writer outreached via telephone on 01-14-18 and 01-15-18 with no success. Writer sent engagement letter on 01-16-18, requesting to Higher education careers advisercontact writer by 01-30-18. Writer has not heard anything from Ronald Richard at this time.     Condition at Time of Discharge: (symptoms, functioning, unresolved problems, recommendations for future care): Ronald Richard abstained from opiates for 4 days of attending the ancillary stabilization program, but continued to use benzodiazepines illicitly as well as marijuana. Ronald Richard's motivation for treatment and higher level  of care should be assessed due to safety concerns with fleeting treatment frequently.     WAS DISCHARGE PLAN COMPLETED? No, writer was unable to contact patient    IS FURTHER MENTAL HEALTH SERVICE, INCLUDING PSYCHOPHARMACOTHERAPY, RECOMMENDED AT THIS TIME? Yes, but patient has dropped out of care  If "No" or "Yes, but patient has dropped out of care," skip to Discharge Instructions.    DOES PATIENT DISAGREE WITH DISCHARGE FROM THIS PROGRAM? No :  If Yes, Medical Director's signature is required.      REASON FOR DISCHARGE:     06  PATIENT LOST TO CONTACT     INSTRUCTIONS TO BE CONSIDERED FOR FUTURE RE-ADMISSION: Ronald Richard's motivation and a higher level of care such a inpatient should be assessed due to safety concerns of continuing benzodiazepine use with suboxone. Ronald Richard may also benefit from OTP due to unsuccessful attempts at engaging with suboxone.     [facility] Helpline and [employer] Crisis Team: (24 hours/7 days) [phone], [phone](TTY) [phone], (Out of [place]) [phone]

## 2018-12-12 ENCOUNTER — Emergency Department (HOSPITAL_COMMUNITY): Payer: No Typology Code available for payment source

## 2018-12-12 ENCOUNTER — Encounter (HOSPITAL_COMMUNITY): Payer: Self-pay | Admitting: Radiology

## 2018-12-12 ENCOUNTER — Other Ambulatory Visit: Payer: Self-pay

## 2018-12-12 ENCOUNTER — Emergency Department (HOSPITAL_COMMUNITY)
Admission: EM | Admit: 2018-12-12 | Discharge: 2018-12-12 | Disposition: A | Discharge status: Home / Self Care | Payer: No Typology Code available for payment source | Attending: Emergency Medicine | Admitting: Emergency Medicine

## 2018-12-12 DIAGNOSIS — F17210 Nicotine dependence, cigarettes, uncomplicated: Secondary | ICD-10-CM | POA: Insufficient documentation

## 2018-12-12 DIAGNOSIS — Y92410 Unspecified street and highway as the place of occurrence of the external cause: Secondary | ICD-10-CM | POA: Diagnosis not present

## 2018-12-12 DIAGNOSIS — R109 Unspecified abdominal pain: Secondary | ICD-10-CM | POA: Insufficient documentation

## 2018-12-12 DIAGNOSIS — S91312A Laceration without foreign body, left foot, initial encounter: Secondary | ICD-10-CM | POA: Diagnosis not present

## 2018-12-12 DIAGNOSIS — Z79899 Other long term (current) drug therapy: Secondary | ICD-10-CM | POA: Insufficient documentation

## 2018-12-12 DIAGNOSIS — R0602 Shortness of breath: Secondary | ICD-10-CM | POA: Diagnosis not present

## 2018-12-12 DIAGNOSIS — Z23 Encounter for immunization: Secondary | ICD-10-CM | POA: Diagnosis not present

## 2018-12-12 DIAGNOSIS — Y999 Unspecified external cause status: Secondary | ICD-10-CM | POA: Diagnosis not present

## 2018-12-12 DIAGNOSIS — S060X9A Concussion with loss of consciousness of unspecified duration, initial encounter: Secondary | ICD-10-CM | POA: Diagnosis not present

## 2018-12-12 DIAGNOSIS — S0181XA Laceration without foreign body of other part of head, initial encounter: Secondary | ICD-10-CM

## 2018-12-12 DIAGNOSIS — S01112A Laceration without foreign body of left eyelid and periocular area, initial encounter: Secondary | ICD-10-CM | POA: Insufficient documentation

## 2018-12-12 DIAGNOSIS — Y93I9 Activity, other involving external motion: Secondary | ICD-10-CM | POA: Insufficient documentation

## 2018-12-12 LAB — COMPREHENSIVE METABOLIC PANEL
ALT: 21 U/L (ref 0–44)
AST: 20 U/L (ref 15–41)
Albumin: 3.4 g/dL — ABNORMAL LOW (ref 3.5–5.0)
Alkaline Phosphatase: 45 U/L (ref 38–126)
Anion gap: 10 (ref 5–15)
BUN: 8 mg/dL (ref 6–20)
CO2: 22 mmol/L (ref 22–32)
Calcium: 8 mg/dL — ABNORMAL LOW (ref 8.9–10.3)
Chloride: 108 mmol/L (ref 98–111)
Creatinine, Ser: 1.05 mg/dL (ref 0.61–1.24)
GFR calc Af Amer: >60 mL/min (ref >60)
GFR calc non Af Amer: >60 mL/min (ref >60)
Glucose, Bld: 120 mg/dL — ABNORMAL HIGH (ref 70–99)
Potassium: 3.1 mmol/L — ABNORMAL LOW (ref 3.5–5.1)
Sodium: 140 mmol/L (ref 135–145)
Total Bilirubin: 0.8 mg/dL (ref 0.3–1.2)
Total Protein: 6 g/dL — ABNORMAL LOW (ref 6.5–8.1)

## 2018-12-12 LAB — I-STAT CHEM 8, ED
BUN: 8 mg/dL (ref 6–20)
Calcium, Ion: 0.92 mmol/L — ABNORMAL LOW (ref 1.15–1.40)
Chloride: 106 mmol/L (ref 98–111)
Creatinine, Ser: 1.1 mg/dL (ref 0.61–1.24)
Glucose, Bld: 115 mg/dL — ABNORMAL HIGH (ref 70–99)
HCT: 37 % — ABNORMAL LOW (ref 39.0–52.0)
Hemoglobin: 12.6 g/dL — ABNORMAL LOW (ref 13.0–17.0)
Potassium: 3.2 mmol/L — ABNORMAL LOW (ref 3.5–5.1)
Sodium: 138 mmol/L (ref 135–145)
TCO2: 21 mmol/L — ABNORMAL LOW (ref 22–32)

## 2018-12-12 LAB — URINALYSIS, ROUTINE W REFLEX MICROSCOPIC
Bacteria, UA: NONE SEEN
Bilirubin Urine: NEGATIVE
Glucose, UA: NEGATIVE mg/dL
Ketones, ur: NEGATIVE mg/dL
Leukocytes,Ua: NEGATIVE
Nitrite: NEGATIVE
Protein, ur: NEGATIVE mg/dL
Specific Gravity, Urine: >1.046 — ABNORMAL HIGH (ref 1.005–1.030)
pH: 5 (ref 5.0–8.0)

## 2018-12-12 LAB — CBC
HCT: 38.5 % — ABNORMAL LOW (ref 39.0–52.0)
Hemoglobin: 13.1 g/dL (ref 13.0–17.0)
MCH: 31.7 pg (ref 26.0–34.0)
MCHC: 34 g/dL (ref 30.0–36.0)
MCV: 93.2 fL (ref 80.0–100.0)
Platelets: 285 10*3/uL (ref 150–400)
RBC: 4.13 MIL/uL — ABNORMAL LOW (ref 4.22–5.81)
RDW: 12.4 % (ref 11.5–15.5)
WBC: 10.4 10*3/uL (ref 4.0–10.5)
nRBC: 0 % (ref 0.0–0.2)

## 2018-12-12 LAB — PROTIME-INR
INR: 1.1 (ref 0.8–1.2)
Prothrombin Time: 14.1 seconds (ref 11.4–15.2)

## 2018-12-12 LAB — SAMPLE TO BLOOD BANK

## 2018-12-12 LAB — RAPID URINE DRUG SCREEN, HOSP PERFORMED
Amphetamines: POSITIVE — AB
Barbiturates: NOT DETECTED
Benzodiazepines: POSITIVE — AB
Cocaine: POSITIVE — AB
Opiates: POSITIVE — AB
Tetrahydrocannabinol: POSITIVE — AB

## 2018-12-12 LAB — LACTIC ACID, PLASMA: Lactic Acid, Venous: 2 mmol/L (ref 0.5–1.9)

## 2018-12-12 LAB — CDS SEROLOGY

## 2018-12-12 LAB — ETHANOL: Alcohol, Ethyl (B): 30 mg/dL — ABNORMAL HIGH (ref <10)

## 2018-12-12 MED ORDER — IOHEXOL 300 MG/ML  SOLN
100.0000 mL | Freq: Once | INTRAMUSCULAR | Status: AC | PRN
  Administered 2018-12-12: 04:00:00 100 mL via INTRAVENOUS

## 2018-12-12 MED ORDER — SODIUM CHLORIDE 0.9 % IV BOLUS
1000.0000 mL | Freq: Once | INTRAVENOUS | Status: AC
  Administered 2018-12-12: 05:00:00 1000 mL via INTRAVENOUS

## 2018-12-12 MED ORDER — LIDOCAINE-EPINEPHRINE (PF) 2 %-1:200000 IJ SOLN
10.0000 mL | Freq: Once | INTRAMUSCULAR | Status: AC
  Administered 2018-12-12: 10 mL via INTRADERMAL
  Filled 2018-12-12: qty 20

## 2018-12-12 MED ORDER — IBUPROFEN 400 MG PO TABS
600.0000 mg | ORAL_TABLET | Freq: Once | ORAL | Status: AC
  Administered 2018-12-12: 07:00:00 600 mg via ORAL
  Filled 2018-12-12: qty 1

## 2018-12-12 MED ORDER — LIDOCAINE-EPINEPHRINE-TETRACAINE (LET) SOLUTION
3.0000 mL | Freq: Once | NASAL | Status: AC
  Administered 2018-12-12: 3 mL via TOPICAL
  Filled 2018-12-12: qty 3

## 2018-12-12 MED ORDER — TETANUS-DIPHTH-ACELL PERTUSSIS 5-2.5-18.5 LF-MCG/0.5 IM SUSP
0.5000 mL | Freq: Once | INTRAMUSCULAR | Status: AC
  Administered 2018-12-12: 0.5 mL via INTRAMUSCULAR
  Filled 2018-12-12: qty 0.5
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
## 2018-12-12 NOTE — Discharge Instructions (Addendum)
Please follow-up for suture removal at either urgent care ,the emergency department, or your primary care doctor. The facial sutures can be taken out in 5 days and the sutures in your foot can come out in 7-10 days.   Please return to the emergency room immediately if you experience any new or worsening symptoms or any symptoms that indicate worsening infection such as fevers, increased redness/swelling/pain, warmth, or drainage from the affected area.
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
## 2022-04-06 ENCOUNTER — Telehealth: Payer: Self-pay

## 2022-04-06 NOTE — Telephone Encounter (Signed)
Mychart msg sent. AS, CMA

## 2023-04-16 ENCOUNTER — Other Ambulatory Visit: Payer: Self-pay

## 2023-04-16 ENCOUNTER — Encounter (HOSPITAL_COMMUNITY): Payer: Self-pay

## 2023-04-16 ENCOUNTER — Emergency Department (HOSPITAL_COMMUNITY)
Admission: EM | Admit: 2023-04-16 | Discharge: 2023-04-16 | Disposition: A | Discharge status: Home / Self Care | Payer: Self-pay | Attending: Emergency Medicine | Admitting: Emergency Medicine

## 2023-04-16 DIAGNOSIS — L0231 Cutaneous abscess of buttock: Secondary | ICD-10-CM | POA: Insufficient documentation

## 2023-04-16 DIAGNOSIS — L0211 Cutaneous abscess of neck: Secondary | ICD-10-CM | POA: Insufficient documentation

## 2023-04-16 DIAGNOSIS — L0291 Cutaneous abscess, unspecified: Secondary | ICD-10-CM

## 2023-04-16 MED ORDER — DOXYCYCLINE HYCLATE 100 MG PO CAPS: 100.0000 mg | ORAL_CAPSULE | Freq: Two times a day (BID) | ORAL | 0 refills | Status: AC

## 2023-04-16 MED ORDER — DOXYCYCLINE HYCLATE 100 MG PO TABS
100.0000 mg | ORAL_TABLET | Freq: Once | ORAL | Status: AC
  Administered 2023-04-16: 100 mg via ORAL
  Filled 2023-04-16: qty 1

## 2023-04-16 MED ORDER — LIDOCAINE-EPINEPHRINE (PF) 2 %-1:200000 IJ SOLN
10.0000 mL | Freq: Once | INTRAMUSCULAR | Status: AC
  Administered 2023-04-16: 10 mL via INTRADERMAL
  Filled 2023-04-16: qty 20

## 2023-04-16 MED ORDER — HYDROCODONE-ACETAMINOPHEN 5-325 MG PO TABS
1.0000 | ORAL_TABLET | Freq: Once | ORAL | Status: AC
  Administered 2023-04-16: 1 via ORAL
  Filled 2023-04-16: qty 1

## 2023-04-16 NOTE — ED Triage Notes (Signed)
 Pt has an abscess on right buttocks approx the size of a golf ball. The abscess is hard and intact. The abscess is erythematous. Pt states it's been there 3-4 days. Pt ac/o abscess on right neck near hair line approx the size of a nickle. It's intact and erythematous.

## 2023-04-16 NOTE — Discharge Instructions (Signed)
 We saw you in the ER for the abscess. Please see a primary care doctor or urgent care or return to the ER for the packing removal and wound recheck.  Please use warm compresses to help express out the fluid.  Please take the antibiotics I have prescribed to help decrease the chance of infection.  Keep the wound clean and dry otherwise, and allow for any drainage to occur. Please follow up with primary care provider to be rechecked in the next week to ensure you are healing properly. If symptoms change or worsen, please return to ER.

## 2023-04-16 NOTE — ED Provider Notes (Signed)
 [place] [facility] AT [facility] Provider Note   CSN: [id number] Arrival date & time: 04/16/23  1124     History  Chief Complaint  Patient presents with   Abscess    Joshua Rhodes is a 38 y.o. male history of substance abuse presented for abscess for the past 3 days 1 on his neck and 1 on his right buttocks.  Patient states the area 1 on his neck is in his hairline on the back right side and right buttocks 1 on his right butt cheek.  Patient denies any difficulty urinating states that there is no pain or redness in his perineum region.  Patient denies abdominal pain, nausea vomiting, fevers, neck stiffness or pain, vision changes, or headaches.  Patient does not have history of abscesses.  Home Medications Prior to Admission medications   Medication Sig Start Date End Date Taking? Authorizing Provider  doxycycline (VIBRAMYCIN) 100 MG capsule Take 1 capsule (100 mg total) by mouth 2 (two) times daily for 7 days. 04/16/23 04/23/23 Yes Joshua Ress, Beverly Gust, PA-C  lamoTRIgine (LAMICTAL) 25 MG tablet TAKE 1 tab daily for 1 week and than 2 tab daily 09/22/13   Arfeen, Phillips Grout, MD  QUEtiapine (SEROQUEL) 100 MG tablet Take 1 tablet (100 mg total) by mouth at bedtime. 09/22/13   Joshua Nipper, MD      Allergies    Sulfa antibiotics    Review of Systems   Review of Systems  Physical Exam Updated Vital Signs BP (!) 142/92   Pulse 89   Temp 98.3 F (36.8 C)   Resp 17   Ht 6' (1.829 m)   Wt 127 kg   SpO2 98%   BMI 37.97 kg/m  Physical Exam Exam conducted with a chaperone present Joshua Rhodes, Med Student).  Constitutional:      General: He is not in acute distress. Skin:    General: Skin is warm and dry.          Comments: Posterior neck: 1 cm fluctuant cyst noted with erythema, no signs of purulent drainage or any drainage tender to palpation Right buttock: Area of erythema with small area of fluctuance in the middle without signs of purulent material Unable to  express out any purulent material No perineum involvement  Neurological:     Mental Status: He is alert.     ED Results / Procedures / Treatments   Labs (all labs ordered are listed, but only abnormal results are displayed) Labs Reviewed - No data to display  EKG None  Radiology No results found.  Procedures .Incision and Drainage  Date/Time: 04/16/2023 2:03 PM  Performed by: Joshua Corrigan, PA-C Authorized by: Joshua Corrigan, PA-C   Consent:    Consent obtained:  Verbal   Consent given by:  Patient   Risks, benefits, and alternatives were discussed: yes     Risks discussed:  Damage to other organs, bleeding, incomplete drainage, infection and pain Universal protocol:    Procedure explained and questions answered to patient or proxy's satisfaction: yes     Patient identity confirmed:  Verbally with patient Location:    Type:  Abscess   Size:  1.0   Location:  Neck   Neck location:  R posterior Pre-procedure details:    Skin preparation:  Chlorhexidine with alcohol Sedation:    Sedation type:  None Anesthesia:    Anesthesia method:  Local infiltration   Local anesthetic:  Lidocaine 2% WITH epi Procedure type:  Complexity:  Simple Procedure details:    Ultrasound guidance: yes     Needle aspiration: no     Incision types:  Stab incision   Drainage:  Bloody and purulent   Drainage amount:  Scant   Wound treatment:  Wound left open   Packing materials:  None Post-procedure details:    Procedure completion:  Tolerated .Incision and Drainage  Date/Time: 04/16/2023 2:04 PM  Performed by: Joshua Corrigan, PA-C Authorized by: Joshua Corrigan, PA-C   Consent:    Consent obtained:  Verbal   Consent given by:  Patient   Risks, benefits, and alternatives were discussed: yes     Risks discussed:  Bleeding, damage to other organs, infection, incomplete drainage and pain Universal protocol:    Procedure explained and questions answered to patient or proxy's  satisfaction: yes     Patient identity confirmed:  Verbally with patient Location:    Type:  Abscess   Size:  1.0   Location: right buttocks. Pre-procedure details:    Skin preparation:  Chlorhexidine with alcohol Sedation:    Sedation type:  None Anesthesia:    Anesthesia method:  Local infiltration   Local anesthetic:  Lidocaine 2% WITH epi Procedure type:    Complexity:  Simple Procedure details:    Ultrasound guidance: yes     Needle aspiration: no     Incision types:  Single straight   Incision depth:  Dermal   Drainage:  Bloody and purulent   Drainage amount:  Moderate   Wound treatment:  Wound left open   Packing materials:  None Post-procedure details:    Procedure completion:  Tolerated     Medications Ordered in ED Medications  lidocaine-EPINEPHrine (XYLOCAINE W/EPI) 2 %-1:200000 (PF) injection 10 mL (10 mLs Intradermal Given 04/16/23 1337)  HYDROcodone-acetaminophen (NORCO/VICODIN) 5-325 MG per tablet 1 tablet (1 tablet Oral Given 04/16/23 1337)  doxycycline (VIBRA-TABS) tablet 100 mg (100 mg Oral Given 04/16/23 1358)    ED Course/ Medical Decision Making/ A&P                                 Medical Decision Making Risk Prescription drug management.   Joshua Rhodes 38 y.o. presented today for an abscess.  Working DDx that I considered at this time includes, but not limited to, abscess, cellulitis, erysipelas, HS, folliculitis, lymphangitis, necrotizing fasciitis/cellulitis, Fournier's, DVT.  R/o DDx: erysipelas, HS, folliculitis, lymphangitis, necrotizing fasciitis/cellulitis, Fournier's, DVT: These are considered less likely due to history of present illness, physical exam, labs/imaging findings  Review of prior external notes: 01/28/2018 Office Visit  Unique Tests and My Independent Interpretation: none  Social Determinants of Health: none  Discussion with Independent Historian: None  Discussion of Management of Tests: None  Risk: Medium:  Prescription medication use  Risk Stratification Score: none  EMERGENCY DEPARTMENT US SOFT TISSUE INTERPRETATION "Study: Limited Soft Tissue Ultrasound"  INDICATIONS: Soft tissue infection Multiple views of the body part were obtained in real-time with a multi-frequency linear probe  PERFORMED BY: Myself IMAGES ARCHIVED?: Yes SIDE:Right  BODY PART:Neck INTERPRETATION:  Abcess present and Cellulitis present  EMERGENCY DEPARTMENT US SOFT TISSUE INTERPRETATION "Study: Limited Soft Tissue Ultrasound"  INDICATIONS: Soft tissue infection Multiple views of the body part were obtained in real-time with a multi-frequency linear probe  PERFORMED BY: Myself IMAGES ARCHIVED?: Yes SIDE:Right  BODY PART: Buttocks INTERPRETATION:  Abcess present and Cellulitis present  Plan: On exam patient was  in no acute distress with stable vitals.  With a chaperone present at bedside ultrasound was done that shows tiny abscesses with surrounding cellulitis.  Ultrasound shows very tiny abscesses. no signs of perineum involvement that would be concerning for Fournier's.  Patient is not endorsing any systemic infectious symptoms and taken to the sepsis order necrotizing.  Bedside ultrasound does not show soft tissue air indicative necrotizing cellulitis.  I discussed with the patient the risks and benefit of doing an incision and drainage and patient with full decision-making capacity verbalized their understanding symptoms and want to proceed with incision and drainage.  I&D was performed and does show purulent material which does indicate antibiotic use.  Will give patient 1 dose of doxycycline here and prescribe the rest and follow-up with his primary care provider.  I spoke with patient following up in 2 days for wound check.  I discussed with the patient red flag symptoms to be aware of including fevers, worsening of abscess, etc. patient verbalized understanding of this.  I encouraged patient to use heat packs  to help with the draining.  Patient was given return precautions. Patient stable for discharge at this time.  Patient verbalized understanding of plan.  This chart was dictated using voice recognition software.  Despite best efforts to proofread,  errors can occur which can change the documentation meaning.         Final Clinical Impression(s) / ED Diagnoses Final diagnoses:  Abscess  Gluteal abscess    Rx / DC Orders ED Discharge Orders          Ordered    doxycycline (VIBRAMYCIN) 100 MG capsule  2 times daily        04/16/23 1358              Remi Deter 04/16/23 1407    Melene Plan, DO 04/16/23 1520

## 8386-10-22 DEATH — deceased
